# Patient Record
Sex: Female | Born: 1995 | Hispanic: Yes | Smoking: Former smoker
Health system: Southern US, Community
[De-identification: ages and names within clinical notes are randomized; demographics above are authoritative.]

## PROBLEM LIST (undated history)

## (undated) DIAGNOSIS — Z8709 Personal history of other diseases of the respiratory system: Secondary | ICD-10-CM

## (undated) DIAGNOSIS — D649 Anemia, unspecified: Secondary | ICD-10-CM

## (undated) DIAGNOSIS — J45909 Unspecified asthma, uncomplicated: Secondary | ICD-10-CM

## (undated) DIAGNOSIS — E049 Nontoxic goiter, unspecified: Secondary | ICD-10-CM

## (undated) DIAGNOSIS — K219 Gastro-esophageal reflux disease without esophagitis: Secondary | ICD-10-CM

## (undated) DIAGNOSIS — O30009 Twin pregnancy, unspecified number of placenta and unspecified number of amniotic sacs, unspecified trimester: Secondary | ICD-10-CM

## (undated) HISTORY — DX: Twin pregnancy, unspecified number of placenta and unspecified number of amniotic sacs, unspecified trimester: O30.009

## (undated) HISTORY — PX: WISDOM TOOTH EXTRACTION: SHX21

## (undated) HISTORY — PX: ECTOPIC PREGNANCY SURGERY: SHX613

## (undated) HISTORY — DX: Personal history of other diseases of the respiratory system: Z87.09

## (undated) HISTORY — PX: HIP FRACTURE SURGERY: SHX118

## (undated) HISTORY — DX: Nontoxic goiter, unspecified: E04.9

## (undated) HISTORY — PX: SALPINGECTOMY: SHX328

## (undated) HISTORY — PX: BONY PELVIS SURGERY: SHX572

## (undated) HISTORY — PX: TONSILLECTOMY: SUR1361

---
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
## 2014-11-08 DIAGNOSIS — E049 Nontoxic goiter, unspecified: Secondary | ICD-10-CM | POA: Insufficient documentation

## 2014-12-13 NOTE — Op Note (Signed)
PATIENT NAME:  Dionisio DavidRIVERA, Latunya A MR#:  [id number] DATE OF BIRTH:  01-09-1996  DATE OF PROCEDURE:  05/01/2012  PREOPERATIVE DIAGNOSIS: Suspected ectopic pregnancy.   POSTOPERATIVE DIAGNOSIS:  1. Left tubal pregnancy, unruptured.  2. 125 mL hemoperitoneum.  3. O positive blood type.   OPERATIVE PROCEDURE: Operative laparoscopy with left partial salpingectomy.   SURGEON: Prentice DockerMartin A. Ketzaly Cardella, MD   FIRST ASSISTANT: None.   ANESTHESIA: General endotracheal.   INDICATIONS: The patient is a 19 year old white Hispanic female, gravida 1, para 0, last menstrual period 03/21/2012 at approximately [redacted] weeks gestation, who presents for suspected ectopic pregnancy management. The patient has presented with abdominal and pelvic pain, bleeding, and abnormally rising hCG titers. Ultrasound revealed free peritoneal fluid and an empty uterus.   FINDINGS AT SURGERY: A 6 x 2 cm left tubal pregnancy, unruptured; there were some products of conception and clot extruding from the fimbriated end of the tube. The right tube and ovary, left ovary, and uterus were all normal. There was 125 mL hemoperitoneum identified.   DESCRIPTION OF PROCEDURE: The patient was brought to the Operating Room where she was placed in the supine position. General endotracheal anesthesia was induced without difficulty. She was placed in dorsal lithotomy position and was prepped and draped in the usual sterile fashion. The patient was placed in the low lithotomy position. A Hulka tenaculum was placed onto the cervix. The bladder was drained of 300 mL of clear urine. Laparoscopy was performed in a routine manner. A subumbilical vertical 5 mm incision was made with direct entry then being done into the abdomen with the Optiview laparoscopic trocar system. No bowel or vascular injury was encountered. Pneumoperitoneum was created. A 5 mm port was placed in the right lower quadrant along with an 11 mm port in the left lower quadrant under direct  visualization. The above-noted findings were photo documented. The hemoperitoneum was evacuated using suction. The pelvis was irrigated. The left tubal pregnancy was removed using the Ace Harmonic scalpel. The 6 x 2 cm specimen was removed without difficulty and placed in an EndoCatch which was then removed in a routine manner. Good hemostasis was noted. Copious irrigation of the pelvis was again completed. Hemostasis was confirmed. Bilateral ureteral peristalsis was noted to be normal. The upper abdomen was assessed and notable for a tense gallbladder without any evidence of adhesions or other abnormalities. The procedure was then terminated with all instrumentation being removed from the abdomen. The pneumoperitoneum was released. The incisions were closed with 4-0 Vicryl suture in a simple running manner and then covered with Dermabond. The patient was then awakened, extubated and taken to the recovery room  in satisfactory condition. Blood loss was minimal. Complications were none. All instruments, needle and sponge counts were verified as correct.  ____________________________ Prentice DockerMartin A. Aradhya Shellenbarger, MD mad:cbb D: 05/01/2012 14:13:09 ET T: 05/02/2012 09:39:14 ET JOB#: [id number]  cc: Daphine DeutscherMartin A. Shawon Denzer, MD, <Dictator> Prentice DockerMARTIN A Janiyah Beery MD ELECTRONICALLY SIGNED 05/04/2012 16:40

## 2014-12-13 NOTE — H&P (Signed)
PATIENT NAME:  Dionisio DavidRIVERA, Ira A MR#:  [id number] DATE OF BIRTH:  10-25-1995  DATE OF ADMISSION:  05/01/2012  PREOPERATIVE DIAGNOSIS: Suspected ectopic pregnancy.   HISTORY OF PRESENT ILLNESS:  Ivan AnchorsLaura Goranson is a 19 year old single Hispanic female gravida 1, para zero, last menstrual period 03/21/2012, who presents for surgical management of suspected ectopic pregnancy. The patient had a positive pregnancy test and has had several visits to the Emergency Room because of vaginal bleeding, pain, and positive hCG titers. Her hCG titers have been rising abnormally, going from 772 on 04/23/2012 to 887 on 04/28/2012 to a value of 1268 on 04/30/2012.  Ultrasound in the Emergency Room revealed an empty uterus with free fluid in the pelvis, especially located in the left adnexal region. No adnexal masses were seen. The patient's blood type is A+. In light of these findings and the patient's symptoms,  she was counseled to undergo surgical evaluation through laparoscopy.   PAST MEDICAL HISTORY/ CHRONIC ILLNESSES: Denied.   PAST SURGICAL HISTORY: None.   PAST OB HISTORY: Para zero.   FAMILY HISTORY: Negative for cancer of the colon, ovary, or breast.   SOCIAL HISTORY: The patient does not smoke, does not drink, and does not use drugs.   CURRENT MEDICATIONS: None.   DRUG ALLERGIES: None.   PHYSICAL EXAMINATION:  VITAL SIGNS: Height 65 inches, weight 168 pounds, body mass index 28, heart rate 66, blood pressure 107/67.   GENERAL: The patient is a pleasant well-appearing female in no acute distress. She is alert and oriented.   OROPHARYNX: Clear.   NECK: Supple. Thyromegaly is absent.   LUNGS: Clear.   HEART: Regular rate and rhythm without murmur.   ABDOMEN: Soft. No peritoneal signs are noted. No organomegaly.   PELVIC: External genitalia, blood tinged. BUS normal. Vagina has minimal blood in the vault. Cervix is closed to ring forceps. There was no cervical motion tenderness. Uterus is midplane,  top normal size, nontender. Adnexa nonpalpable, nontender.   RECTAL: External rectal exam is normal.   EXTREMITIES: Without clubbing, cyanosis, or edema.   SKIN: Without rash.   NEUROLOGIC: Normal.   IMPRESSION:  1. Suspected ectopic pregnancy.  2. O+ blood type.   PLAN: Laparoscopy with excision of ectopic. Date of surgery:  05/01/2012.   CONSENT NOTE: Ivan AnchorsLaura Bottcher is to undergo surgery for suspected ectopic pregnancy. She is understanding of the planned procedure and is aware of and is accepting of all surgical risks which include but are not limited to bleeding, infection, pelvic organ injury with need for repair, blood clot disorders, anesthesia risks, and death. The patient understands that we will do the minimal surgery necessary in order to optimize her fertility potential for the future. She is accepting of all risks and wishes to proceed with surgery.    ____________________________ Prentice DockerMartin A. Michal Strzelecki, MD mad:bjt D:  05/01/2012 11:47:57 ET          T: 05/01/2012 12:23:45 ET        JOB#: [id number]  Daphine DeutscherMARTIN A Michelina Mexicano MD ELECTRONICALLY SIGNED 05/04/2012 16:40

## 2015-01-03 NOTE — H&P (Signed)
L&D Evaluation:  History:  HPI 19 yo Hispanic female G2P0010 at 40.3 weeks admitted for IOL.   Patient's Medical History Mirena IUD in place   Patient's Surgical History Laparoscopic Lt Salpingectomy for ectopic   Medications Pre Natal Vitamins   Allergies NKDA   Social History none   Family History Non-Contributory   ROS:  ROS All systems were reviewed.  HEENT, CNS, GI, GU, Respiratory, CV, Renal and Musculoskeletal systems were found to be normal.   Exam:  Vital Signs stable   Urine Protein negative dipstick   General no apparent distress   Mental Status clear   Heart normal sinus rhythm   Abdomen gravid, non-tender   Estimated Fetal Weight Average for gestational age, 8#4   Back no CVAT   Edema 1+   Reflexes 2+   Pelvic no external lesions   Mebranes AROM - Clear   Ucx regular   Skin dry   Lymph no lymphadenopathy   Impression:  Impression 40.3 weeks Intrauterine pregnancy; IUD in place (unab le to remove)   Plan:  Plan Pitocin IOL   Comments O+/ATB-/NR/RI/VI/HB-/HIV-/GBS-   Electronic Signatures: Javonta Gronau, Prentice DockerMartin A (MD)  (Signed 17-Jun-14 08:33)  Authored: L&D Evaluation   Last Updated: 17-Jun-14 08:33 by Rosia Syme, Prentice DockerMartin A (MD)

## 2015-03-14 DIAGNOSIS — S32129A Unspecified Zone II fracture of sacrum, initial encounter for closed fracture: Secondary | ICD-10-CM | POA: Insufficient documentation

## 2015-05-23 DIAGNOSIS — S334XXA Traumatic rupture of symphysis pubis, initial encounter: Secondary | ICD-10-CM | POA: Insufficient documentation

## 2016-06-22 ENCOUNTER — Encounter: Payer: Self-pay | Admitting: Emergency Medicine

## 2016-06-22 DIAGNOSIS — T192XXA Foreign body in vulva and vagina, initial encounter: Secondary | ICD-10-CM | POA: Insufficient documentation

## 2016-06-22 DIAGNOSIS — Y9389 Activity, other specified: Secondary | ICD-10-CM | POA: Diagnosis not present

## 2016-06-22 DIAGNOSIS — Y999 Unspecified external cause status: Secondary | ICD-10-CM | POA: Diagnosis not present

## 2016-06-22 DIAGNOSIS — X58XXXA Exposure to other specified factors, initial encounter: Secondary | ICD-10-CM | POA: Insufficient documentation

## 2016-06-22 DIAGNOSIS — Y929 Unspecified place or not applicable: Secondary | ICD-10-CM | POA: Diagnosis not present

## 2016-06-22 DIAGNOSIS — Z5321 Procedure and treatment not carried out due to patient leaving prior to being seen by health care provider: Secondary | ICD-10-CM | POA: Insufficient documentation

## 2016-06-22 NOTE — ED Triage Notes (Signed)
Patient states that she put a tampon in about 4 hours ago. When she went to change her tampon she was unable to find the string. She states that she felt for the tampon but unable to feel it.

## 2016-06-23 ENCOUNTER — Emergency Department
Admission: EM | Admit: 2016-06-23 | Discharge: 2016-06-23 | Disposition: A | Discharge status: Home / Self Care | Payer: Medicaid Other | Attending: Emergency Medicine | Admitting: Emergency Medicine

## 2017-05-02 ENCOUNTER — Encounter: Payer: Self-pay | Admitting: Emergency Medicine

## 2017-05-02 ENCOUNTER — Emergency Department
Admission: EM | Admit: 2017-05-02 | Discharge: 2017-05-02 | Disposition: A | Discharge status: Home / Self Care | Payer: BLUE CROSS/BLUE SHIELD | Attending: Emergency Medicine | Admitting: Emergency Medicine

## 2017-05-02 DIAGNOSIS — L0231 Cutaneous abscess of buttock: Secondary | ICD-10-CM | POA: Diagnosis present

## 2017-05-02 DIAGNOSIS — L0501 Pilonidal cyst with abscess: Secondary | ICD-10-CM | POA: Diagnosis not present

## 2017-05-02 MED ORDER — HYDROCODONE-ACETAMINOPHEN 5-325 MG PO TABS: 1.0000 | ORAL_TABLET | Freq: Four times a day (QID) | ORAL | 0 refills | Status: DC | PRN

## 2017-05-02 MED ORDER — LIDOCAINE HCL (PF) 1 % IJ SOLN
5.0000 mL | Freq: Once | INTRAMUSCULAR | Status: AC
  Administered 2017-05-02: 5 mL
  Filled 2017-05-02: qty 5

## 2017-05-02 MED ORDER — SULFAMETHOXAZOLE-TRIMETHOPRIM 800-160 MG PO TABS: 1.0000 | ORAL_TABLET | Freq: Two times a day (BID) | ORAL | 0 refills | Status: DC

## 2017-05-02 NOTE — ED Triage Notes (Signed)
Pt to ED with c/o of cyst to lower sacrum. Pt stats she has had a cyst there before "many years ago" and that it returned approximately 3 weeks ago.

## 2017-05-02 NOTE — ED Provider Notes (Signed)
[facility] Emergency Department Provider Note  ____________________________________________   First MD Initiated Contact with Patient 05/02/17 1259     (approximate)  I have reviewed the triage vital signs and the nursing notes.   HISTORY  Chief Complaint Abscess   HPI Alexis Clark is a 21 y.o. female is here with complaint of cyst. Patient states that she has had this before many years ago. She states that he began returning approximately 3 weeks ago. She denies any known fever or chills. Patient states that it is more swollen today. She is not aware of any drainage. She rates her pain as an 8 out of 10.  History reviewed. No pertinent past medical history.  There are no active problems to display for this patient.   Past Surgical History:  Procedure Laterality Date  . BONY PELVIS SURGERY      Prior to Admission medications   Medication Sig Start Date End Date Taking? Authorizing Provider  HYDROcodone-acetaminophen (NORCO/VICODIN) 5-325 MG tablet Take 1 tablet by mouth every 6 (six) hours as needed for moderate pain. 05/02/17   Tommi Rumps, PA-C  sulfamethoxazole-trimethoprim (BACTRIM DS,SEPTRA DS) 800-160 MG tablet Take 1 tablet by mouth 2 (two) times daily. 05/02/17   Tommi Rumps, PA-C    Allergies Patient has no known allergies.  History reviewed. No pertinent family history.  Social History Social History  Substance Use Topics  . Smoking status: Never Smoker  . Smokeless tobacco: Never Used  . Alcohol use Yes    Review of Systems Constitutional: No fever/chills Cardiovascular: Denies chest pain. Respiratory: Denies shortness of breath. Gastrointestinal:   No nausea, no vomiting.  Skin: Positive for abscess Neurological: Negative for headaches, focal weakness or numbness.  ___________________________________________   PHYSICAL EXAM:  VITAL SIGNS:   Enc Vitals Group     BP (!) 111/55     Pulse Rate 80     Resp  18     Temp 98.1 F (36.7 C)     Temp Source Oral     SpO2 99 %     Weight 210 lb (95.3 kg)     Height  (1.651 m)     Head Circumference      Peak Flow      Pain Score 8     Pain Loc      Pain Edu?      Excl. in GC?    Constitutional: Alert and oriented. Well appearing and in no acute distress. Eyes: Conjunctivae are normal.  Head: Atraumatic. Nose: No congestion/rhinnorhea. Neck: No stridor.   Cardiovascular: Normal rate, regular rhythm. Grossly normal heart sounds.  Good peripheral circulation. Respiratory: Normal respiratory effort.  No retractions. Lungs CTAB. Gastrointestinal: Soft and nontender. No distention.  Musculoskeletal: Moves upper and lower extremities without difficulty and normal gait was noted. Neurologic:  Normal speech and language. No gross focal neurologic deficits are appreciated.  Skin:  Skin is warm, dry and intact. There is an approximate 3 cm abscess noted to the right medial buttocks that is fluctuant and moderately tender to palpation. No evidence of drainage is noted. Area has minimal erythema. Psychiatric: Mood and affect are normal. Speech and behavior are normal.  ____________________________________________   LABS (all labs ordered are listed, but only abnormal results are displayed)  Labs Reviewed - No data to display  PROCEDURES  Procedure(s) performed:  INCISION AND DRAINAGE Performed by: Tommi Rumps Consent: Verbal consent obtained. Risks and benefits: risks, benefits and  alternatives were discussed Type: abscess  Body area: Right buttocks medial aspect.  Anesthesia: local infiltration  Incision was made with a scalpel.  Local anesthetic: lidocaine 1 % without epinephrine  Anesthetic total: 2 ml  Complexity: complex Blunt dissection to break up loculations  Drainage: purulent  Drainage amount: Moderate   Packing material: 1/4 in iodoform gauze  Patient tolerance: Patient tolerated the procedure well with no  immediate complications.    Procedures  Critical Care performed: No  ____________________________________________   INITIAL IMPRESSION / ASSESSMENT AND PLAN / ED COURSE  Pertinent labs & imaging results that were available during my care of the patient were reviewed by me and considered in my medical decision making (see chart for details).  Patient is placed on Bactrim DS twice a day for 10 days and given a prescription for Norco one every 6 hours as needed for pain. She is given a note to remain out of work for the next 2 days. Patient was also given information about the surgeon on call and encouraged to call and make an appointment. Patient is also aware that she needs to return to the emergency room or go to an urgent care for removal of her packing in 2 days.   ___________________________________________   FINAL CLINICAL IMPRESSION(S) / ED DIAGNOSES  Final diagnoses:  Pilonidal abscess      NEW MEDICATIONS STARTED DURING THIS VISIT:  Discharge Medication List as of 05/02/2017  2:08 PM    START taking these medications   Details  HYDROcodone-acetaminophen (NORCO/VICODIN) 5-325 MG tablet Take 1 tablet by mouth every 6 (six) hours as needed for moderate pain., Starting Fri 05/02/2017, Print    sulfamethoxazole-trimethoprim (BACTRIM DS,SEPTRA DS) 800-160 MG tablet Take 1 tablet by mouth 2 (two) times daily., Starting Fri 05/02/2017, Print         Note:  This document was prepared using Dragon voice recognition software and may include unintentional dictation errors.    Tommi RumpsSummers, Shary Lamos L, PA-C 05/02/17 1447    Loleta RoseForbach, Cory, MD 05/02/17 [phone]

## 2017-05-02 NOTE — Discharge Instructions (Signed)
Begin taking Bactrim DS twice a day for 10 days. Norco every 6 hours as needed for pain. Do not drive or operate machinery while taking this medication. You may take ibuprofen if needed for pain in between. Return to the emergency room or any urgent care for packing removal in 2 days. The name of the surgeon along with phone number is listed on your discharge papers for a future appointment.

## 2017-05-07 ENCOUNTER — Telehealth: Payer: Self-pay | Admitting: General Practice

## 2017-05-07 NOTE — Telephone Encounter (Signed)
Left a voicemail for the patient to call the office to schedule an appointment from when she was seen in the ED, patient needs a ED Follow-up (05/02/17): Pilonidal Abscess (Requiring I&D in ED, and Place on schedule for next available surgeon. Please schedule if the patient calls.

## 2017-05-16 NOTE — Telephone Encounter (Signed)
Patient coming in on 05/22/17  With Dr. Earlene Plater

## 2017-05-22 ENCOUNTER — Ambulatory Visit: Payer: Medicaid Other | Admitting: Surgery

## 2017-05-23 ENCOUNTER — Ambulatory Visit: Payer: Self-pay | Admitting: Surgery

## 2017-06-05 ENCOUNTER — Ambulatory Visit: Payer: Self-pay | Admitting: Surgery

## 2017-06-10 ENCOUNTER — Telehealth: Payer: Self-pay | Admitting: General Practice

## 2017-06-10 NOTE — Telephone Encounter (Signed)
Patient has cancelled and no showed twice for her appointments. Unable to reschedule per nurse after three missed appointments, patient is unable to rescheduled.

## 2017-09-19 ENCOUNTER — Encounter: Payer: Self-pay | Admitting: Emergency Medicine

## 2017-09-19 ENCOUNTER — Other Ambulatory Visit: Payer: Self-pay

## 2017-09-19 ENCOUNTER — Emergency Department
Admission: EM | Admit: 2017-09-19 | Discharge: 2017-09-19 | Disposition: A | Discharge status: Home / Self Care | Payer: BLUE CROSS/BLUE SHIELD | Attending: Emergency Medicine | Admitting: Emergency Medicine

## 2017-09-19 DIAGNOSIS — L0501 Pilonidal cyst with abscess: Secondary | ICD-10-CM | POA: Diagnosis not present

## 2017-09-19 DIAGNOSIS — Z79899 Other long term (current) drug therapy: Secondary | ICD-10-CM | POA: Insufficient documentation

## 2017-09-19 DIAGNOSIS — R52 Pain, unspecified: Secondary | ICD-10-CM | POA: Diagnosis present

## 2017-09-19 MED ORDER — TRAMADOL HCL 50 MG PO TABS
50.0000 mg | ORAL_TABLET | Freq: Four times a day (QID) | ORAL | 0 refills | Status: AC | PRN
Start: 2017-09-19 — End: 2017-09-22

## 2017-09-19 MED ORDER — CLINDAMYCIN PHOSPHATE 300 MG/2ML IJ SOLN
INTRAMUSCULAR | Status: AC
  Filled 2017-09-19: qty 4

## 2017-09-19 MED ORDER — CLINDAMYCIN HCL 300 MG PO CAPS: 300.0000 mg | ORAL_CAPSULE | Freq: Three times a day (TID) | ORAL | 0 refills | Status: AC

## 2017-09-19 MED ORDER — CLINDAMYCIN PHOSPHATE 600 MG/4ML IJ SOLN
600.0000 mg | Freq: Once | INTRAMUSCULAR | Status: AC
  Administered 2017-09-19: 600 mg via INTRAMUSCULAR
  Filled 2017-09-19: qty 4

## 2017-09-19 NOTE — ED Provider Notes (Signed)
[facility] Emergency Department Provider Note  ____________________________________________  Time seen: Approximately 5:51 PM  I have reviewed the triage vital signs and the nursing notes.   HISTORY  Chief Complaint Abscess    HPI Alexis Clark is a 22 y.o. female presents to the emergency department with 8 out of 10 superior buttocks pain.  Patient has a history of pilonidal cyst.  Patient reports that she has undergone incision and drainage to the area before.  Patient reports that she made an appointment with her general surgeon and has an appointment on September 26, 2017.  Patient denies fever and chills and reports that pain is manageable but has become increasingly persistent.  Patient reports that she has had symptoms for 2 weeks.  Triage note noted.  No alleviating measures have been attempted.   History reviewed. No pertinent past medical history.  Patient Active Problem List   Diagnosis Date Noted  . 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 Zone II fracture of sacrum (HCC) 03/14/2015    Past Surgical History:  Procedure Laterality Date  . BONY PELVIS SURGERY      Prior to Admission medications   Medication Sig Start Date End Date Taking? Authorizing Provider  clindamycin (CLEOCIN) 300 MG capsule Take 1 capsule (300 mg total) by mouth 3 (three) times daily for 10 days. 09/19/17 09/29/17  Orvil Feil, PA-C  HYDROcodone-acetaminophen (NORCO/VICODIN) 5-325 MG tablet Take 1 tablet by mouth every 6 (six) hours as needed for moderate pain. 05/02/17   Tommi Rumps, PA-C  sulfamethoxazole-trimethoprim (BACTRIM DS,SEPTRA DS) 800-160 MG tablet Take 1 tablet by mouth 2 (two) times daily. 05/02/17   Tommi Rumps, PA-C  traMADol (ULTRAM) 50 MG tablet Take 1 tablet (50 mg total) by mouth every 6 (six) hours as needed for up to 3 days. 09/19/17 09/22/17  Orvil Feil, PA-C    Allergies Patient has no known allergies.  No family history on  file.  Social History Social History   Tobacco Use  . Smoking status: Never Smoker  . Smokeless tobacco: Never Used  Substance Use Topics  . Alcohol use: Yes  . Drug use: No     Review of Systems  Constitutional: No fever/chills Eyes: No visual changes. No discharge ENT: No upper respiratory complaints. Cardiovascular: no chest pain. Respiratory: no cough. No SOB. Gastrointestinal: No abdominal pain.  No nausea, no vomiting.  No diarrhea.  No constipation. Musculoskeletal: Negative for musculoskeletal pain. Skin: Patient has pilonidal cyst. Neurological: Negative for headaches, focal weakness or numbness.  ____________________________________________   PHYSICAL EXAM:  VITAL SIGNS: ED Triage Vitals  Enc Vitals Group     BP 09/19/17 1738 111/65     Pulse Rate 09/19/17 1738 73     Resp 09/19/17 1738 18     Temp 09/19/17 1738 98.4 F (36.9 C)     Temp Source 09/19/17 1738 Oral     SpO2 09/19/17 1738 99 %     Weight 09/19/17 1728 210 lb (95.3 kg)     Height 09/19/17 1728 5\' 6"  (1.676 m)     Head Circumference --      Peak Flow --      Pain Score 09/19/17 1728 6     Pain Loc --      Pain Edu? --      Excl. in GC? --      Constitutional: Alert and oriented. Well appearing and in no acute distress. Eyes: Conjunctivae are normal. PERRL.  EOMI. Cardiovascular: Normal rate, regular rhythm. Normal S1 and S2.  Good peripheral circulation. Respiratory: Normal respiratory effort without tachypnea or retractions. Lungs CTAB. Good air entry to the bases with no decreased or absent breath sounds. Musculoskeletal: Full range of motion to all extremities. No gross deformities appreciated. Neurologic:  Normal speech and language. No gross focal neurologic deficits are appreciated.  Skin: Patient has a 3 cm x 3 cm abscess along the superior aspect of the intergluteal crease.  Induration is appreciated but no fluctuance.  No surrounding cellulitis. Psychiatric: Mood and affect are  normal. Speech and behavior are normal. Patient exhibits appropriate insight and judgement.   ____________________________________________   LABS (all labs ordered are listed, but only abnormal results are displayed)  Labs Reviewed - No data to display ____________________________________________  EKG   ____________________________________________  RADIOLOGY   No results found.  ____________________________________________    PROCEDURES  Procedure(s) performed:    Procedures    Medications  clindamycin (CLEOCIN) injection 600 mg (not administered)     ____________________________________________   INITIAL IMPRESSION / ASSESSMENT AND PLAN / ED COURSE  Pertinent labs & imaging results that were available during my care of the patient were reviewed by me and considered in my medical decision making (see chart for details).  Review of the [place] CSRS was performed in accordance of the [facility] prior to dispensing any controlled drugs.     Assessment and plan Pilonidal cyst Patient presents to the emergency department with an abscess at the superior aspect of the intergluteal fold consistent with a pilonidal cyst.  I offered to conduct incision and drainage in the emergency department but advised patient that if she has an appointment with general surgery on 09/26/2017 to delay an additional incision and drainage procedure until general surgery can perform an evaluation.  Patient agreed that her pain is manageable at this point and would like to be evaluated by general surgery.  Clindamycin was given in the emergency department and patient was discharged with clindamycin as well as a short course of tramadol for pain.  Return precautions were given.  All patient questions were answered.    ____________________________________________  FINAL CLINICAL IMPRESSION(S) / ED DIAGNOSES  Final diagnoses:  Pilonidal abscess      NEW MEDICATIONS STARTED DURING THIS  VISIT:  ED Discharge Orders        Ordered    clindamycin (CLEOCIN) 300 MG capsule  3 times daily     09/19/17 1746    traMADol (ULTRAM) 50 MG tablet  Every 6 hours PRN     09/19/17 1748          This chart was dictated using voice recognition software/Dragon. Despite best efforts to proofread, errors can occur which can change the meaning. Any change was purely unintentional.    Orvil FeilWoods, Liseth Wann M, PA-C 09/19/17 1755    Myrna BlazerSchaevitz, David Matthew, MD 09/19/17 2017

## 2017-09-19 NOTE — ED Triage Notes (Signed)
asbcess to left buttock

## 2017-09-19 NOTE — ED Notes (Signed)
See triage note states she developed possible abscess area to buttocks about 1 week .

## 2017-09-26 ENCOUNTER — Ambulatory Visit (INDEPENDENT_AMBULATORY_CARE_PROVIDER_SITE_OTHER): Payer: BLUE CROSS/BLUE SHIELD | Admitting: Surgery

## 2017-09-26 ENCOUNTER — Encounter: Payer: Self-pay | Admitting: Surgery

## 2017-09-26 VITALS — BP 117/60 | HR 63 | Temp 98.0°F | Ht 65.0 in | Wt 210.0 lb

## 2017-09-26 DIAGNOSIS — L0501 Pilonidal cyst with abscess: Secondary | ICD-10-CM | POA: Diagnosis not present

## 2017-09-26 NOTE — Patient Instructions (Signed)
You have been seen today for a Pilonidal Cyst. We have scheduled for you to have this area surgical removed on 2/12 at [facility] with Dr. Aleen CampiPiscoya.  Please see your blue sheet for further instructions.  To keep this cyst as minimal as possible, you will want to shave or use Veet (Hair Remover) on this area to keep as much hair out of the tracts as you can, have a family member pull hair from the tracts if possible, keep the area clean and dry as possible. Wash with soap and water once daily and keep a guaze on this area at all times while draining.  If we excise this area (surgery) in the future, you will need to arrange to be out of work for approximately 1-2 weeks and then have a family member change the dressing 1-2 times daily until this heals from the inside out.   Please call our office with any questions or concerns.    Pilonidal Cyst A pilonidal cyst is a fluid-filled sac. It forms beneath the skin near your tailbone, at the top of the crease of your buttocks. A pilonidal cyst that is not large or infected may not cause symptoms or problems. If the cyst becomes irritated or infected, it may fill with pus. This causes pain and swelling (pilonidal abscess). An infected cyst may need to be treated with medicine, drained, or removed. CAUSES The cause of a pilonidal cyst is not known. One cause may be a hair that grows into your skin (ingrown hair). RISK FACTORS Pilonidal cysts are more common in boys and men. Risk factors include:  Having lots of hair near the crease of the buttocks.  Being overweight.  Having a pilonidal dimple.  Wearing tight clothing.  Not bathing or showering frequently.  Sitting for long periods of time. SIGNS AND SYMPTOMS Signs and symptoms of a pilonidal cyst may include:  Redness.  Pain and tenderness.  Warmth.  Swelling.  Pus.  Fever. DIAGNOSIS Your health care provider may diagnose a pilonidal cyst based on your symptoms and a physical exam. The  health care provider may do a blood test to check for infection. If your cyst is draining pus, your health care provider may take a sample of the drainage to be tested at a laboratory. TREATMENT Surgery is the usual treatment for an infected pilonidal cyst. You may also have to take medicines before surgery. The type of surgery you have depends on the size and severity of the infected cyst. The different kinds of surgery include:  Incision and drainage. This is a procedure to open and drain the cyst.  Marsupialization. In this procedure, a large cyst or abscess may be opened and kept open by stitching the edges of the skin to the cyst walls.  Cyst removal. This procedure involves opening the skin and removing all or part of the cyst. HOME CARE INSTRUCTIONS  Follow all of your surgeon's instructions carefully if you had surgery.  Take medicines only as directed by your health care provider.  If you were prescribed an antibiotic medicine, finish it all even if you start to feel better.  Keep the area around your pilonidal cyst clean and dry.  Clean the area as directed by your health care provider. Pat the area dry with a clean towel. Do not rub it as this may cause bleeding.  Remove hair from the area around the cyst as directed by your health care provider.  Do not wear tight clothing or sit in  one place for long periods of time.  There are many different ways to close and cover an incision, including stitches, skin glue, and adhesive strips. Follow your health care provider's instructions on:  Incision care.  Bandage (dressing) changes and removal.  Incision closure removal. SEEK MEDICAL CARE IF:   You have drainage, redness, swelling, or pain at the site of the cyst.  You have a fever.   This information is not intended to replace advice given to you by your health care provider. Make sure you discuss any questions you have with your health care provider.   Document  Released: 08/09/2000 Document Revised: 09/02/2014 Document Reviewed: 12/30/2013 Elsevier Interactive Patient Education [employer].

## 2017-09-26 NOTE — H&P (View-Only) (Signed)
09/26/2017  Reason for Visit:  Pilonidal cyst with abscess  History of Present Illness: Alexis Clark is a 22 y.o. female who presents with a history of pilonidal cyst with abscess.  Patient was seen in the emergency room initially in September 2018 and required incision and drainage at bedside.  She did not follow-up with us afterwards.  She did go back to the emergency room last week as she noticed that she was starting to have a flareup of her pilonidal cyst.  It did not require incision and drainage at the time and she was started on clindamycin.  However this week her cyst started spontaneously draining purulent material.  Her pain is now improved and the inflammation around the pilonidal area is improved as well.  She continues taking her antibiotics.  There is no further drainage anymore.  She presents today for further evaluation.  Denies any fevers at home or chills.  Denies any nausea or vomiting.  Past Medical History: --Pilonidal cyst  -History of asthma as a child but -History of chlamydia   Past Surgical History: Past Surgical History:  Procedure Laterality Date   Tonsillectomy    . BONY PELVIS SURGERY      Home Medications: Prior to Admission medications   Medication Sig Start Date End Date Taking? Authorizing Provider  clindamycin (CLEOCIN) 300 MG capsule Take 1 capsule (300 mg total) by mouth 3 (three) times daily for 10 days. 09/19/17 09/29/17 Yes Woods, Jaclyn M, PA-C    Allergies: No Known Allergies  Social History:  reports that  has never smoked. she has never used smokeless tobacco. She reports that she drinks alcohol. She reports that she does not use drugs.   Family History: --Diabetes type II in both mother and father's side.  Review of Systems: Review of Systems  Constitutional: Negative for chills and fever.  HENT: Negative for hearing loss.   Respiratory: Negative for shortness of breath.   Cardiovascular: Negative for chest pain.  Gastrointestinal:  Negative for abdominal pain, nausea and vomiting.  Genitourinary: Negative for dysuria.  Skin: Negative for rash.       Spontaneous drainage on 1/28 from pilonidal cyst  Neurological: Negative for dizziness.  Psychiatric/Behavioral: Negative for depression.  All other systems reviewed and are negative.   Physical Exam BP 117/60   Pulse 63   Temp 98 F (36.7 C) (Oral)   Ht 5' 5" (1.651 m)   Wt 95.3 kg (210 lb)   BMI 34.95 kg/m  CONSTITUTIONAL: No acute distress HEENT:  Normocephalic, atraumatic, extraocular motion intact. NECK: Trachea is midline, and there is no jugular venous distension.  RESPIRATORY:  Lungs are clear, and breath sounds are equal bilaterally. Normal respiratory effort without pathologic use of accessory muscles. CARDIOVASCULAR: Heart is regular without murmurs, gallops, or rubs. GI: The abdomen is soft, nondistended, nontender.  MUSCULOSKELETAL:  Normal muscle strength and tone in all four extremities.  No peripheral edema or cyanosis. SKIN: Pilonidal cyst area with no erythema or induration.  There is one small 3 mm wound consistent with spontaneous drainage from her abscess.  No further drainage at this point.  This area is nontender to palpation.  The patient does have some pits in the cleft consistent with pilonidal disease. NEUROLOGIC:  Motor and sensation is grossly normal.  Cranial nerves are grossly intact. PSYCH:  Alert and oriented to person, place and time. Affect is normal.  Laboratory Analysis: No results found for this or any previous visit (from the past 24   hour(s)).  Imaging: No results found.  Assessment and Plan: This is a 22 y.o. female who presents with pilonidal cyst with abscess.  -Discussed with the patient that currently this is her second episode of a pilonidal cyst with abscess.  She did require I&D during the first episode and this time around it has drained spontaneously.  She has had high risk of redeveloping this abscess in the  future discussed with the patient that recommendation would be to excise the cyst in the operating room.  Currently her antibiotics are working and the drainage has worked and there is no evidence of worsening infection that would require repeat I&D at this time.  After discussing the risks of the procedure including risk of bleeding, infection, and injury to surrounding structures, she is willing to proceed with excision.  We will schedule her for 10/07/17.  She is to continue her antibiotics until completed.  Understands that this is an outpatient procedure.  The patient understands this plan and all of her questions have been answered.  Face-to-face time spent with the patient and care providers was 45 minutes, with more than 50% of the time spent counseling, educating, and coordinating care of the patient.     Kathe Wirick Luis Alka Falwell, MD [facility]   

## 2017-09-26 NOTE — Progress Notes (Signed)
09/26/2017  Reason for Visit:  Pilonidal cyst with abscess  History of Present Illness: Alexis Clark is a 22 y.o. female who presents with a history of pilonidal cyst with abscess.  Patient was seen in the emergency room initially in September 2018 and required incision and drainage at bedside.  She did not follow-up with [place] afterwards.  She did go back to the emergency room last week as she noticed that she was starting to have a flareup of her pilonidal cyst.  It did not require incision and drainage at the time and she was started on clindamycin.  However this week her cyst started spontaneously draining purulent material.  Her pain is now improved and the inflammation around the pilonidal area is improved as well.  She continues taking her antibiotics.  There is no further drainage anymore.  She presents today for further evaluation.  Denies any fevers at home or chills.  Denies any nausea or vomiting.  Past Medical History: --Pilonidal cyst  -History of asthma as a child but -History of chlamydia   Past Surgical History: Past Surgical History:  Procedure Laterality Date   Tonsillectomy    . BONY PELVIS SURGERY      Home Medications: Prior to Admission medications   Medication Sig Start Date End Date Taking? Authorizing Provider  clindamycin (CLEOCIN) 300 MG capsule Take 1 capsule (300 mg total) by mouth 3 (three) times daily for 10 days. 09/19/17 09/29/17 Yes Pia Mau M, PA-C    Allergies: No Known Allergies  Social History:  reports that  has never smoked. she has never used smokeless tobacco. She reports that she drinks alcohol. She reports that she does not use drugs.   Family History: --Diabetes type II in both mother and father's side.  Review of Systems: Review of Systems  Constitutional: Negative for chills and fever.  HENT: Negative for hearing loss.   Respiratory: Negative for shortness of breath.   Cardiovascular: Negative for chest pain.  Gastrointestinal:  Negative for abdominal pain, nausea and vomiting.  Genitourinary: Negative for dysuria.  Skin: Negative for rash.       Spontaneous drainage on 1/28 from pilonidal cyst  Neurological: Negative for dizziness.  Psychiatric/Behavioral: Negative for depression.  All other systems reviewed and are negative.   Physical Exam BP 117/60   Pulse 63   Temp 98 F (36.7 C) (Oral)   Ht 5\' 5"  (1.651 m)   Wt 95.3 kg (210 lb)   BMI 34.95 kg/m  CONSTITUTIONAL: No acute distress HEENT:  Normocephalic, atraumatic, extraocular motion intact. NECK: Trachea is midline, and there is no jugular venous distension.  RESPIRATORY:  Lungs are clear, and breath sounds are equal bilaterally. Normal respiratory effort without pathologic use of accessory muscles. CARDIOVASCULAR: Heart is regular without murmurs, gallops, or rubs. GI: The abdomen is soft, nondistended, nontender.  MUSCULOSKELETAL:  Normal muscle strength and tone in all four extremities.  No peripheral edema or cyanosis. SKIN: Pilonidal cyst area with no erythema or induration.  There is one small 3 mm wound consistent with spontaneous drainage from her abscess.  No further drainage at this point.  This area is nontender to palpation.  The patient does have some pits in the cleft consistent with pilonidal disease. NEUROLOGIC:  Motor and sensation is grossly normal.  Cranial nerves are grossly intact. PSYCH:  Alert and oriented to person, place and time. Affect is normal.  Laboratory Analysis: No results found for this or any previous visit (from the past 24  hour(s)).  Imaging: No results found.  Assessment and Plan: This is a 22 y.o. female who presents with pilonidal cyst with abscess.  -Discussed with the patient that currently this is her second episode of a pilonidal cyst with abscess.  She did require I&D during the first episode and this time around it has drained spontaneously.  She has had high risk of redeveloping this abscess in the  future discussed with the patient that recommendation would be to excise the cyst in the operating room.  Currently her antibiotics are working and the drainage has worked and there is no evidence of worsening infection that would require repeat I&D at this time.  After discussing the risks of the procedure including risk of bleeding, infection, and injury to surrounding structures, she is willing to proceed with excision.  We will schedule her for 10/07/17.  She is to continue her antibiotics until completed.  Understands that this is an outpatient procedure.  The patient understands this plan and all of her questions have been answered.  Face-to-face time spent with the patient and care providers was 45 minutes, with more than 50% of the time spent counseling, educating, and coordinating care of the patient.     Howie IllJose Luis Joliene Salvador, MD [facility]

## 2017-09-29 ENCOUNTER — Telehealth: Payer: Self-pay | Admitting: Surgery

## 2017-09-29 NOTE — Telephone Encounter (Signed)
I have called patient to go over surgery information below. No answer. I have left a message on voicemail.    pre op date/time and sx date. Sx: 10/07/17 with Dr Assunta FoundPiscoya--pilonidal cyst excision-complex.  Pre op: 10/01/17 between 1-5:00pm--phone interview.   Patient made aware to call [phone], between 1-3:00pm the day before surgery, to find out what time to arrive.

## 2017-09-30 ENCOUNTER — Inpatient Hospital Stay: Admission: RE | Admit: 2017-09-30 | Payer: BLUE CROSS/BLUE SHIELD | Source: Ambulatory Visit

## 2017-10-01 ENCOUNTER — Other Ambulatory Visit: Payer: Self-pay

## 2017-10-01 ENCOUNTER — Encounter
Admission: RE | Admit: 2017-10-01 | Discharge: 2017-10-01 | Disposition: A | Discharge status: Home / Self Care | Payer: BLUE CROSS/BLUE SHIELD | Source: Ambulatory Visit | Attending: Surgery | Admitting: Surgery

## 2017-10-01 HISTORY — DX: Gastro-esophageal reflux disease without esophagitis: K21.9

## 2017-10-01 NOTE — Patient Instructions (Signed)
Your procedure is scheduled on: 10-07-17 Report to Same Day Surgery 2nd floor medical mall (Medical Mall Entrance-take elevator on left to 2nd floor.  Check in with surgery information desk.) To find out your arrival time please call [phone] between 1PM - 3PM on 10-06-17  Remember: Instructions that are not followed completely may result in serious medical risk, up to and including death, or upon the discretion of your surgeon and anesthesiologist your surgery may need to be rescheduled.    _x___ 1. Do not eat food after midnight the night before your procedure. NO GUM OR CANDY AFTER MIDNIGHT.  You may drink clear liquids up to 2 hours before you are scheduled to arrive at the hospital for your procedure.  Do not drink clear liquids within 2 hours of your scheduled arrival to the hospital.  Clear liquids include  --Water or Apple juice without pulp  --Clear carbohydrate beverage such as ClearFast or Gatorade  --Black Coffee or Clear Tea (No milk, no creamers, do not add anything to  the coffee or Tea    __x__ 2. No Alcohol for 24 hours before or after surgery.   __x__3. No Smoking or e-cigarettes for 24 prior to surgery.  Do not use any chewable tobacco products for at least 6 hour prior to surgery   ____  4. Bring all medications with you on the day of surgery if instructed.    __x__ 5. Notify your doctor if there is any change in your medical condition     (cold, fever, infections).    x___6. On the morning of surgery brush your teeth with toothpaste and water.  You may rinse your mouth with mouth wash if you wish.  Do not swallow any toothpaste or mouthwash.   Do not wear jewelry, make-up, hairpins, clips or nail polish.  Do not wear lotions, powders, or perfumes. You may wear deodorant.  Do not shave 48 hours prior to surgery. Men may shave face and neck.  Do not bring valuables to the hospital.    [facility] is not responsible for any belongings or valuables.    Contacts, dentures or bridgework may not be worn into surgery.  Leave your suitcase in the car. After surgery it may be brought to your room.  For patients admitted to the hospital, discharge time is determined by your treatment team.  _  Patients discharged the day of surgery will not be allowed to drive home.  You will need someone to drive you home and stay with you the night of your procedure.     ____ Take anti-hypertensive listed below, cardiac, seizure, asthma, anti-reflux and psychiatric medicines. These include:  1. NONE  2.  3.  4.  5.  6.  ____Fleets enema or Magnesium Citrate as directed.   ____ Use CHG Soap or sage wipes as directed on instruction sheet   ____ Use inhalers on the day of surgery and bring to hospital day of surgery  ____ Stop Metformin and Janumet 2 days prior to surgery.    ____ Take 1/2 of usual insulin dose the night before surgery and none on the morning     surgery.   ____ Follow recommendations from Cardiologist, Pulmonologist or PCP regarding stopping Aspirin, Coumadin, Plavix ,Eliquis, Effient, or Pradaxa, and Pletal.  X____Stop Anti-inflammatories such as Advil, Aleve, Ibuprofen, Motrin, Naproxen, Naprosyn, Goodies powders or aspirin products NOW-OK to take Tylenol   ____ Stop supplements until after surgery.     ____ Bring C-Pap   C-Pap to the hospital.

## 2017-10-01 NOTE — Telephone Encounter (Signed)
I have contacted patient and she has been informed of all surgery and pre op information in previous note.

## 2017-10-06 MED ORDER — CEFAZOLIN SODIUM-DEXTROSE 2-4 GM/100ML-% IV SOLN
2.0000 g | INTRAVENOUS | Status: AC
  Administered 2017-10-07: 2 g via INTRAVENOUS

## 2017-10-07 ENCOUNTER — Encounter
Admission: RE | Disposition: A | Discharge status: Home / Self Care | Payer: Self-pay | Source: Ambulatory Visit | Attending: Surgery

## 2017-10-07 ENCOUNTER — Encounter: Payer: Self-pay | Admitting: Anesthesiology

## 2017-10-07 ENCOUNTER — Ambulatory Visit
Admission: RE | Admit: 2017-10-07 | Discharge: 2017-10-07 | Disposition: A | Discharge status: Home / Self Care | Payer: BLUE CROSS/BLUE SHIELD | Source: Ambulatory Visit | Attending: Surgery | Admitting: Surgery

## 2017-10-07 ENCOUNTER — Ambulatory Visit: Payer: BLUE CROSS/BLUE SHIELD | Admitting: Certified Registered Nurse Anesthetist

## 2017-10-07 DIAGNOSIS — K219 Gastro-esophageal reflux disease without esophagitis: Secondary | ICD-10-CM | POA: Insufficient documentation

## 2017-10-07 DIAGNOSIS — L0501 Pilonidal cyst with abscess: Secondary | ICD-10-CM | POA: Insufficient documentation

## 2017-10-07 DIAGNOSIS — L0591 Pilonidal cyst without abscess: Secondary | ICD-10-CM | POA: Diagnosis not present

## 2017-10-07 HISTORY — PX: PILONIDAL CYST EXCISION: SHX744

## 2017-10-07 LAB — POCT PREGNANCY, URINE: PREG TEST UR: NEGATIVE

## 2017-10-07 SURGERY — EXCISION, PILONIDAL CYST, EXTENSIVE
Anesthesia: General | Site: Coccyx | Wound class: Clean

## 2017-10-07 MED ORDER — LACTATED RINGERS IV SOLN
INTRAVENOUS | Status: DC
  Administered 2017-10-07: 11:00:00 via INTRAVENOUS

## 2017-10-07 MED ORDER — LIDOCAINE HCL (PF) 2 % IJ SOLN
INTRAMUSCULAR | Status: AC
  Filled 2017-10-07: qty 10

## 2017-10-07 MED ORDER — LIDOCAINE HCL (CARDIAC) 20 MG/ML IV SOLN
INTRAVENOUS | Status: DC | PRN
  Administered 2017-10-07: 100 mg via INTRAVENOUS

## 2017-10-07 MED ORDER — FAMOTIDINE 20 MG PO TABS
ORAL_TABLET | ORAL | Status: AC
  Filled 2017-10-07: qty 1

## 2017-10-07 MED ORDER — MIDAZOLAM HCL 2 MG/2ML IJ SOLN
INTRAMUSCULAR | Status: DC | PRN
  Administered 2017-10-07: 2 mg via INTRAVENOUS

## 2017-10-07 MED ORDER — CHLORHEXIDINE GLUCONATE CLOTH 2 % EX PADS: 6.0000 | MEDICATED_PAD | Freq: Once | CUTANEOUS | Status: DC

## 2017-10-07 MED ORDER — ACETAMINOPHEN 500 MG PO TABS
1000.0000 mg | ORAL_TABLET | ORAL | Status: AC
  Administered 2017-10-07: 1000 mg via ORAL

## 2017-10-07 MED ORDER — ONDANSETRON HCL 4 MG/2ML IJ SOLN: 4.0000 mg | Freq: Once | INTRAMUSCULAR | Status: DC | PRN

## 2017-10-07 MED ORDER — FENTANYL CITRATE (PF) 100 MCG/2ML IJ SOLN
INTRAMUSCULAR | Status: AC
  Filled 2017-10-07: qty 2

## 2017-10-07 MED ORDER — FENTANYL CITRATE (PF) 100 MCG/2ML IJ SOLN
25.0000 ug | INTRAMUSCULAR | Status: DC | PRN
  Administered 2017-10-07 (×2): 25 ug via INTRAVENOUS

## 2017-10-07 MED ORDER — PROPOFOL 10 MG/ML IV BOLUS
INTRAVENOUS | Status: AC
  Filled 2017-10-07: qty 20

## 2017-10-07 MED ORDER — FENTANYL CITRATE (PF) 100 MCG/2ML IJ SOLN
INTRAMUSCULAR | Status: AC
  Administered 2017-10-07: 25 ug via INTRAVENOUS
  Filled 2017-10-07: qty 2

## 2017-10-07 MED ORDER — CEFAZOLIN SODIUM-DEXTROSE 2-4 GM/100ML-% IV SOLN
INTRAVENOUS | Status: AC
  Filled 2017-10-07: qty 100

## 2017-10-07 MED ORDER — PHENYLEPHRINE HCL 10 MG/ML IJ SOLN
INTRAMUSCULAR | Status: DC | PRN
  Administered 2017-10-07: 100 ug via INTRAVENOUS

## 2017-10-07 MED ORDER — FAMOTIDINE 20 MG PO TABS
20.0000 mg | ORAL_TABLET | Freq: Once | ORAL | Status: AC
  Administered 2017-10-07: 20 mg via ORAL

## 2017-10-07 MED ORDER — DEXAMETHASONE SODIUM PHOSPHATE 10 MG/ML IJ SOLN
INTRAMUSCULAR | Status: DC | PRN
  Administered 2017-10-07: 10 mg via INTRAVENOUS

## 2017-10-07 MED ORDER — MIDAZOLAM HCL 2 MG/2ML IJ SOLN
INTRAMUSCULAR | Status: AC
  Filled 2017-10-07: qty 2

## 2017-10-07 MED ORDER — BACITRACIN ZINC 500 UNIT/GM EX OINT
TOPICAL_OINTMENT | CUTANEOUS | Status: AC
  Filled 2017-10-07: qty 28.35

## 2017-10-07 MED ORDER — FENTANYL CITRATE (PF) 100 MCG/2ML IJ SOLN
INTRAMUSCULAR | Status: DC | PRN
  Administered 2017-10-07 (×2): 50 ug via INTRAVENOUS

## 2017-10-07 MED ORDER — GABAPENTIN 300 MG PO CAPS
ORAL_CAPSULE | ORAL | Status: AC
  Filled 2017-10-07: qty 1

## 2017-10-07 MED ORDER — KETOROLAC TROMETHAMINE 30 MG/ML IJ SOLN
INTRAMUSCULAR | Status: AC
  Filled 2017-10-07: qty 1

## 2017-10-07 MED ORDER — KETOROLAC TROMETHAMINE 30 MG/ML IJ SOLN
INTRAMUSCULAR | Status: DC | PRN
  Administered 2017-10-07: 30 mg via INTRAVENOUS

## 2017-10-07 MED ORDER — BUPIVACAINE-EPINEPHRINE 0.5% -1:200000 IJ SOLN
INTRAMUSCULAR | Status: DC | PRN
  Administered 2017-10-07: 30 mL

## 2017-10-07 MED ORDER — GABAPENTIN 300 MG PO CAPS
300.0000 mg | ORAL_CAPSULE | ORAL | Status: AC
  Administered 2017-10-07: 300 mg via ORAL

## 2017-10-07 MED ORDER — SUCCINYLCHOLINE CHLORIDE 20 MG/ML IJ SOLN
INTRAMUSCULAR | Status: DC | PRN
  Administered 2017-10-07: 120 mg via INTRAVENOUS

## 2017-10-07 MED ORDER — ACETAMINOPHEN 500 MG PO TABS
ORAL_TABLET | ORAL | Status: AC
  Filled 2017-10-07: qty 2

## 2017-10-07 MED ORDER — OXYCODONE HCL 5 MG PO TABS: 5.0000 mg | ORAL_TABLET | Freq: Four times a day (QID) | ORAL | 0 refills | Status: DC | PRN

## 2017-10-07 MED ORDER — PROPOFOL 10 MG/ML IV BOLUS
INTRAVENOUS | Status: DC | PRN
  Administered 2017-10-07: 150 mg via INTRAVENOUS

## 2017-10-07 MED ORDER — ONDANSETRON HCL 4 MG/2ML IJ SOLN
INTRAMUSCULAR | Status: DC | PRN
  Administered 2017-10-07: 4 mg via INTRAVENOUS

## 2017-10-07 MED ORDER — IBUPROFEN 600 MG PO TABS: 600.0000 mg | ORAL_TABLET | Freq: Three times a day (TID) | ORAL | 0 refills | Status: DC | PRN

## 2017-10-07 SURGICAL SUPPLY — 29 items
BLADE SURG 15 STRL LF DISP TIS (BLADE) ×1 IMPLANT
BLADE SURG 15 STRL SS (BLADE) ×2
BRIEF STRETCH MATERNITY 2XLG (MISCELLANEOUS) ×3 IMPLANT
CANISTER SUCT 1200ML W/VALVE (MISCELLANEOUS) ×3 IMPLANT
DRAPE LAPAROTOMY 100X77 ABD (DRAPES) ×3 IMPLANT
ELECT CAUTERY BLADE 6.4 (BLADE) ×3 IMPLANT
ELECT REM PT RETURN 9FT ADLT (ELECTROSURGICAL) ×3
ELECTRODE REM PT RTRN 9FT ADLT (ELECTROSURGICAL) ×1 IMPLANT
GAUZE FLUFF 18X24 1PLY STRL (GAUZE/BANDAGES/DRESSINGS) ×3 IMPLANT
GLOVE SURG SYN 7.0 (GLOVE) ×6 IMPLANT
GLOVE SURG SYN 7.5  E (GLOVE) ×4
GLOVE SURG SYN 7.5 E (GLOVE) ×2 IMPLANT
GOWN STRL REUS W/ TWL LRG LVL3 (GOWN DISPOSABLE) ×4 IMPLANT
GOWN STRL REUS W/TWL LRG LVL3 (GOWN DISPOSABLE) ×8
KIT TURNOVER KIT A (KITS) ×3 IMPLANT
LABEL OR SOLS (LABEL) ×3 IMPLANT
NEEDLE HYPO 22GX1.5 SAFETY (NEEDLE) ×3 IMPLANT
NS IRRIG 500ML POUR BTL (IV SOLUTION) ×3 IMPLANT
PACK BASIN MINOR ARMC (MISCELLANEOUS) ×3 IMPLANT
PAD ABD DERMACEA PRESS 5X9 (GAUZE/BANDAGES/DRESSINGS) ×3 IMPLANT
SOL PREP PVP 2OZ (MISCELLANEOUS) ×3
SOLUTION PREP PVP 2OZ (MISCELLANEOUS) ×1 IMPLANT
SUT MNCRL 4-0 (SUTURE) ×2
SUT MNCRL 4-0 27XMFL (SUTURE) ×1
SUT VIC AB 3-0 SH 27 (SUTURE) ×2
SUT VIC AB 3-0 SH 27X BRD (SUTURE) ×1 IMPLANT
SUTURE MNCRL 4-0 27XMF (SUTURE) ×1 IMPLANT
SYR 10ML LL (SYRINGE) ×3 IMPLANT
SYR BULB IRRIG 60ML STRL (SYRINGE) ×3 IMPLANT

## 2017-10-07 NOTE — Anesthesia Postprocedure Evaluation (Signed)
Anesthesia Post Note  Patient: Dionisio DavidLaura A Jolliff  Procedure(s) Performed: CYST EXCISION PILONIDAL EXTENSIVE (N/A Coccyx)  Patient location during evaluation: PACU Anesthesia Type: General Level of consciousness: awake and alert and oriented Pain management: pain level controlled Vital Signs Assessment: post-procedure vital signs reviewed and stable Respiratory status: spontaneous breathing, nonlabored ventilation and respiratory function stable Cardiovascular status: blood pressure returned to baseline and stable Postop Assessment: no signs of nausea or vomiting Anesthetic complications: no     Last Vitals:  Vitals:   10/07/17 1401 10/07/17 1407  BP: 110/61 (!) 101/58  Pulse: 67 72  Resp: 16 16  Temp: 36.5 C   SpO2: 100% 99%    Last Pain:  Vitals:   10/07/17 1349  PainSc: 2                  Kimsey Demaree

## 2017-10-07 NOTE — Anesthesia Post-op Follow-up Note (Signed)
Anesthesia QCDR form completed.        

## 2017-10-07 NOTE — Discharge Instructions (Signed)

## 2017-10-07 NOTE — Anesthesia Preprocedure Evaluation (Addendum)
Anesthesia Evaluation  Patient identified by MRN, date of birth, ID band Patient awake    Reviewed: Allergy & Precautions, NPO status , Patient's Chart, lab work & pertinent test results  Airway Mallampati: II  TM Distance: >3 FB     Dental  (+) Teeth Intact   Pulmonary  Resolving URI   Pulmonary exam normal        Cardiovascular negative cardio ROS Normal cardiovascular exam     Neuro/Psych negative neurological ROS  negative psych ROS   GI/Hepatic Neg liver ROS, GERD  Controlled,  Endo/Other  negative endocrine ROS  Renal/GU negative Renal ROS  negative genitourinary   Musculoskeletal negative musculoskeletal ROS (+)   Abdominal Normal abdominal exam  (+)   Peds negative pediatric ROS (+)  Hematology negative hematology ROS (+)   Anesthesia Other Findings Past Medical History: No date: GERD (gastroesophageal reflux disease)     Comment:  OCC-NO MEDS  Reproductive/Obstetrics                            Anesthesia Physical Anesthesia Plan  ASA: II  Anesthesia Plan: General   Post-op Pain Management:    Induction: Intravenous  PONV Risk Score and Plan:   Airway Management Planned: Oral ETT and LMA  Additional Equipment:   Intra-op Plan:   Post-operative Plan: Extubation in OR  Informed Consent: I have reviewed the patients History and Physical, chart, labs and discussed the procedure including the risks, benefits and alternatives for the proposed anesthesia with the patient or authorized representative who has indicated his/her understanding and acceptance.   Dental advisory given  Plan Discussed with: CRNA and Surgeon  Anesthesia Plan Comments:         Anesthesia Quick Evaluation

## 2017-10-07 NOTE — Interval H&P Note (Signed)
History and Physical Interval Note:  10/07/2017 10:36 AM  Alexis Clark  has presented today for surgery, with the diagnosis of pilonidal cyst with abscess  The various methods of treatment have been discussed with the patient and family. After consideration of risks, benefits and other options for treatment, the patient has consented to  Procedure(s): CYST EXCISION PILONIDAL EXTENSIVE (N/A) as a surgical intervention .  The patient's history has been reviewed, patient examined, no change in status, stable for surgery.  I have reviewed the patient's chart and labs.  Questions were answered to the patient's satisfaction.     Sharese Manrique

## 2017-10-07 NOTE — Transfer of Care (Signed)
Immediate Anesthesia Transfer of Care Note  Patient: Alexis Clark  Procedure(s) Performed: CYST EXCISION PILONIDAL EXTENSIVE (N/A Coccyx)  Patient Location: PACU  Anesthesia Type:General  Level of Consciousness: awake  Airway & Oxygen Therapy: Patient Spontanous Breathing  Post-op Assessment: Report given to RN and Post -op Vital signs reviewed and stable  Post vital signs: Reviewed and stable  Last Vitals:  Vitals:   10/07/17 1034 10/07/17 1310  BP: 115/60 116/78  Pulse: 84 96  Resp: 18 12  Temp: 37.5 C (!) 36 C  SpO2: 100% 100%    Last Pain:  Vitals:   10/07/17 1310  PainSc: 0-No pain         Complications: No apparent anesthesia complications

## 2017-10-07 NOTE — Op Note (Signed)
  Procedure Date:  10/07/2017  Pre-operative Diagnosis:   Pilonidal cyst  Post-operative Diagnosis:  Pilonidal cyst  Procedure:  Pilonidal cyst excision, closure in multiple layers  Surgeon:  Howie IllJose Luis Hokulani Rogel, MD  Anesthesia:  General endotracheal  Estimated Blood Loss:  5 ml  Specimens:  Pilonidal cyst  Complications:  None  Indications for Procedure:  This is a 22 y.o. female with diagnosis of pilonidal cyst, requiring excision.  The risks of bleeding, abscess or infection, injury to surrounding structures, and need for further procedures were all discussed with the patient and was willing to proceed.  Description of Procedure: The patient was correctly identified in the preoperative area and brought into the operating room.  The patient was placed supine with VTE prophylaxis in place.  Appropriate time-outs were performed.  Anesthesia was induced and the patient was intubated.  Appropriate antibiotics were infused.  The patient was then placed in prone jacknife position.  The patient's posterior sacral area was prepped and draped in usual sterile fashion.  An elliptical 7 cm incision was made encompassing three pilonidal pits and the cyst.  Cautery was used to dissect down the subcutaneous tissue and to dissect the cyst and pits en bloc.  Cautery was used for hemostasis as well.  The cavity was then irrigated thoroughly.  30 ml of 0.5% Bupivacaine with epi was infused throughout the cavity and dermal layers.  The wound was then closed in multiple layers using 0 Vicryl suture and 3-0 Vicryl for dermal layer.  The skin was closed using 2-0 Nylon.  The wound was cleaned and dressed with Bacitracin ointment, Telfa gauze, 4x4 gauze, and Tegaderm.   The patient was then placed in supine position, emerged from anesthesia, extubated, and brought to the recovery room for further management.  The patient tolerated the procedure well and all counts were correct at the end of the case.   Howie IllJose  Luis Adaora Mchaney, MD

## 2017-10-07 NOTE — OR Nursing (Signed)
Discharge instructions discussed with pt and sister. Both voice understanding. 

## 2017-10-07 NOTE — Anesthesia Procedure Notes (Signed)
Procedure Name: Intubation Date/Time: 10/07/2017 11:38 AM Performed by: Doreen Salvage, CRNA Pre-anesthesia Checklist: Patient identified, Patient being monitored, Timeout performed, Emergency Drugs available and Suction available Patient Re-evaluated:Patient Re-evaluated prior to induction Oxygen Delivery Method: Circle system utilized Preoxygenation: Pre-oxygenation with 100% oxygen Induction Type: IV induction Ventilation: Mask ventilation without difficulty Laryngoscope Size: Mac, Sabra Heck and 2 Grade View: Grade I Tube type: Oral Tube size: 7.0 mm Number of attempts: 1 Airway Equipment and Method: Stylet Placement Confirmation: ETT inserted through vocal cords under direct vision,  positive ETCO2 and breath sounds checked- equal and bilateral Secured at: 21 cm Tube secured with: Tape Dental Injury: Teeth and Oropharynx as per pre-operative assessment

## 2017-10-08 ENCOUNTER — Encounter: Payer: Self-pay | Admitting: Surgery

## 2017-10-08 LAB — SURGICAL PATHOLOGY

## 2017-10-15 ENCOUNTER — Encounter: Payer: Self-pay | Admitting: Surgery

## 2017-10-15 ENCOUNTER — Ambulatory Visit (INDEPENDENT_AMBULATORY_CARE_PROVIDER_SITE_OTHER): Payer: BLUE CROSS/BLUE SHIELD | Admitting: Surgery

## 2017-10-15 VITALS — BP 84/49 | HR 100 | Temp 97.8°F | Ht 65.0 in | Wt 212.0 lb

## 2017-10-15 DIAGNOSIS — Z09 Encounter for follow-up examination after completed treatment for conditions other than malignant neoplasm: Secondary | ICD-10-CM

## 2017-10-15 MED ORDER — AMOXICILLIN-POT CLAVULANATE 875-125 MG PO TABS: 1.0000 | ORAL_TABLET | Freq: Two times a day (BID) | ORAL | 0 refills | Status: AC

## 2017-10-15 NOTE — Progress Notes (Signed)
S/p pilonidal excision by Dr. Aleen CampiPiscoya Some drainage from wound No other issues  PE NAD Wound healing well, some serous drainage, very small inferior area of erythema.  A/p chronic drainage after excision of pilonidal not unexpected Short course of A/bs we will keep sutures for now rtc Dr Demetrius CharityP next week

## 2017-10-15 NOTE — Patient Instructions (Addendum)
Today we have removed your sutures and placed you on an antibiotic.  We will follow up with you as listed below:

## 2017-10-22 ENCOUNTER — Encounter: Payer: Self-pay | Admitting: Surgery

## 2017-10-23 ENCOUNTER — Encounter: Payer: Self-pay | Admitting: Surgery

## 2017-10-30 ENCOUNTER — Encounter: Payer: Self-pay | Admitting: Surgery

## 2017-10-30 ENCOUNTER — Ambulatory Visit (INDEPENDENT_AMBULATORY_CARE_PROVIDER_SITE_OTHER): Payer: BLUE CROSS/BLUE SHIELD | Admitting: Surgery

## 2017-10-30 VITALS — BP 126/76 | HR 85 | Temp 98.0°F | Ht 65.0 in | Wt 208.0 lb

## 2017-10-30 DIAGNOSIS — Z09 Encounter for follow-up examination after completed treatment for conditions other than malignant neoplasm: Secondary | ICD-10-CM

## 2017-10-30 NOTE — Progress Notes (Signed)
10/30/2017  HPI: Patient is status post pilonidal cyst excision on 2/12.  Patient presents for further follow-up.  She was seen on 2/20 by Dr. Elenor LegatoPavone during which time there was some drainage coming from the inferior portion of the wound as a precaution she was given a course of antibiotics.  Sutures were kept in place and presents now for follow-up for suture removal.  She reports that she has been doing better although there is still some soreness at the incision and some serosanguineous drainage coming from the inferior portion still.  There is no erythema or purulent fluid.  Vital signs: BP 126/76   Pulse 85   Temp 98 F (36.7 C) (Oral)   Ht 5\' 5"  (1.651 m)   Wt 94.3 kg (208 lb)   BMI 34.61 kg/m    Physical Exam: Constitutional: No acute distress Skin: Pilonidal cyst excision site wound is clean and dry with only mild serosanguineous drainage at the inferior portion of the wound.  He there was used to mildly probed the wound and it appears that is healing well otherwise.  The inferior portion of the wound and a couple other spots were treated with silver nitrate with no complications.  4 of the 8 sutures were cut and removed and dry gauze dressing with tape was applied over the wound.  Assessment/Plan: 22 year old female status post pilonidal cyst excision.  -Reassured the patient that currently things are healing well with no further evidence of any infection.  At this point silver nitrate should help with cauterized openings and helped him healing scar down.  She will come back to the office next week for removal of the remaining sutures.  She understands that if the wound does not look healthy at that time the sutures will remain in place for 1 more week.   Howie IllJose Luis Syana Degraffenreid, MD [facility]

## 2017-10-30 NOTE — Patient Instructions (Addendum)
Please see your follow up appointment listed below.  °

## 2017-11-03 DIAGNOSIS — E669 Obesity, unspecified: Secondary | ICD-10-CM | POA: Insufficient documentation

## 2017-11-06 ENCOUNTER — Ambulatory Visit (INDEPENDENT_AMBULATORY_CARE_PROVIDER_SITE_OTHER): Payer: BLUE CROSS/BLUE SHIELD | Admitting: Surgery

## 2017-11-06 ENCOUNTER — Encounter: Payer: Self-pay | Admitting: Surgery

## 2017-11-06 VITALS — BP 112/70 | HR 81 | Temp 97.9°F | Ht 65.0 in | Wt 205.6 lb

## 2017-11-06 DIAGNOSIS — Z09 Encounter for follow-up examination after completed treatment for conditions other than malignant neoplasm: Secondary | ICD-10-CM

## 2017-11-06 NOTE — Patient Instructions (Signed)
We removed the remaining sutures today. You may notice a bloody drainage. Please keep some gauze over the area.  Please continue to wash the area with soap and water daily.  Please see your follow up appointment listed below.

## 2017-11-07 ENCOUNTER — Encounter: Payer: BLUE CROSS/BLUE SHIELD | Admitting: Surgery

## 2017-11-07 NOTE — Progress Notes (Signed)
S/p pilonidal doing well Some drainage  PE NAD Wound w some mild dehiscence, rest of the stiches   A/P Doing well D/w her about wound care, this will take  Several weeks to heal No infection RTC w Dr. Aleen CampiPiscoya

## 2017-11-12 ENCOUNTER — Encounter: Payer: Self-pay | Admitting: Surgery

## 2017-11-12 ENCOUNTER — Ambulatory Visit (INDEPENDENT_AMBULATORY_CARE_PROVIDER_SITE_OTHER): Payer: BLUE CROSS/BLUE SHIELD | Admitting: Surgery

## 2017-11-12 VITALS — BP 106/68 | HR 76 | Temp 97.9°F | Wt 207.0 lb

## 2017-11-12 DIAGNOSIS — Z09 Encounter for follow-up examination after completed treatment for conditions other than malignant neoplasm: Secondary | ICD-10-CM

## 2017-11-12 NOTE — Progress Notes (Signed)
11/12/2017  HPI: Patient is s/p pilonidal cyst excision on 2/12.  The last of the sutures were removed on 3/14.  She reports doing well, with very minimal drainage from the wound.  Denies any purulent drainage, worsening pain, erythema, or induration.  Vital signs: BP 106/68   Pulse 76   Temp 97.9 F (36.6 C) (Oral)   Wt 93.9 kg (207 lb)   BMI 34.45 kg/m    Physical Exam: Constitutional: No acute distress Skin:  Pilonidal excision wound clean, without any evidence of infection.  No drainage on manual pressure.  There are multiple small openings along the incision, measuring between 2-5 mm in size.  These were treated with silver nitrate and dressed with gauze/tape.  Assessment/Plan: 22 yo female s/p pilonidal cyst excision  --silver nitrate applied to multiple small openings along the incision.  There was no evidence of infection and it is healing appropriately. --she will follow up in two weeks for another wound check.   Howie IllJose Luis Houa Nie, MD [facility]

## 2017-11-12 NOTE — Patient Instructions (Addendum)
We will see you back in 2 weeks to make sure that you are doing better. 

## 2017-11-27 ENCOUNTER — Encounter: Payer: Self-pay | Admitting: Surgery

## 2018-06-02 LAB — HM HIV SCREENING LAB: HM HIV SCREENING: NEGATIVE

## 2018-06-29 DIAGNOSIS — N926 Irregular menstruation, unspecified: Secondary | ICD-10-CM | POA: Insufficient documentation

## 2018-10-05 DIAGNOSIS — N926 Irregular menstruation, unspecified: Secondary | ICD-10-CM

## 2018-10-05 DIAGNOSIS — E049 Nontoxic goiter, unspecified: Secondary | ICD-10-CM

## 2018-10-05 DIAGNOSIS — E669 Obesity, unspecified: Secondary | ICD-10-CM

## 2018-10-05 DIAGNOSIS — Z6833 Body mass index (BMI) 33.0-33.9, adult: Secondary | ICD-10-CM

## 2019-01-15 ENCOUNTER — Telehealth: Payer: Self-pay

## 2019-01-15 NOTE — Telephone Encounter (Signed)
Coronavirus (COVID-19) Are you at risk?  Are you at risk for the Coronavirus (COVID-19)?  To be considered HIGH RISK for Coronavirus (COVID-19), you have to meet the following criteria:  . Traveled to [place], [place], [place], [place] or [place]; or in the [place] to [place], [place], [place], or [place]; and have fever, cough, and shortness of breath within the last 2 weeks of travel OR . Been in close contact with a person diagnosed with COVID-19 within the last 2 weeks and have fever, cough, and shortness of breath . IF YOU DO NOT MEET THESE CRITERIA, YOU ARE CONSIDERED LOW RISK FOR COVID-19.  What to do if you are HIGH RISK for COVID-19?  . If you are having a medical emergency, call 911. . Seek medical care right away. Before you go to a doctor's office, urgent care or emergency department, call ahead and tell them about your recent travel, contact with someone diagnosed with COVID-19, and your symptoms. You should receive instructions from your physician's office regarding next steps of care.  . When you arrive at healthcare provider, tell the healthcare staff immediately you have returned from visiting [place], [place], [place], [place] or [place]; or traveled in the [place] to [place], [place], [place], or [place]; in the last two weeks or you have been in close contact with a person diagnosed with COVID-19 in the last 2 weeks.   . Tell the health care staff about your symptoms: fever, cough and shortness of breath. . After you have been seen by a medical provider, you will be either: o Tested for (COVID-19) and discharged home on quarantine except to seek medical care if symptoms worsen, and asked to  - Stay home and avoid contact with others until you get your results (4-5 days)  - Avoid travel on public transportation if possible (such as bus, train, or airplane) or o Sent to the Emergency Department by EMS for evaluation, COVID-19 testing, and possible  admission depending on your condition and test results.  What to do if you are LOW RISK for COVID-19?  Reduce your risk of any infection by using the same precautions used for avoiding the common cold or flu:  . Wash your hands often with soap and warm water for at least 20 seconds.  If soap and water are not readily available, use an alcohol-based hand sanitizer with at least 60% alcohol.  . If coughing or sneezing, cover your mouth and nose by coughing or sneezing into the elbow areas of your shirt or coat, into a tissue or into your sleeve (not your hands). . Avoid shaking hands with others and consider head nods or verbal greetings only. . Avoid touching your eyes, nose, or mouth with unwashed hands.  . Avoid close contact with people who are [place] Dirosa. . Avoid places or events with large numbers of people in one location, like concerts or sporting events. . Carefully consider travel plans you have or are making. . If you are planning any travel outside or inside the [place], visit the CDC's Travelers' Health webpage for the latest health notices. . If you have some symptoms but not all symptoms, continue to monitor at home and seek medical attention if your symptoms worsen. . If you are having a medical emergency, call 911.  01/15/19 SCREENING NEG SLS ADDITIONAL HEALTHCARE OPTIONS FOR PATIENTS  [facility] / e-Visit: [email or web address]         [facility]: [phone]    [facility]: [phone]                   [facility]: [phone]  

## 2019-01-19 ENCOUNTER — Encounter: Payer: BLUE CROSS/BLUE SHIELD | Admitting: Certified Nurse Midwife

## 2019-01-25 ENCOUNTER — Other Ambulatory Visit: Payer: Self-pay

## 2019-01-25 ENCOUNTER — Encounter: Payer: Self-pay | Admitting: Certified Nurse Midwife

## 2019-01-25 ENCOUNTER — Other Ambulatory Visit (HOSPITAL_COMMUNITY)
Admission: RE | Admit: 2019-01-25 | Discharge: 2019-01-25 | Disposition: A | Discharge status: Home / Self Care | Payer: Medicaid Other | Source: Ambulatory Visit | Attending: Certified Nurse Midwife | Admitting: Certified Nurse Midwife

## 2019-01-25 ENCOUNTER — Ambulatory Visit: Payer: BLUE CROSS/BLUE SHIELD | Admitting: Certified Nurse Midwife

## 2019-01-25 VITALS — BP 109/76 | HR 69 | Ht 65.0 in | Wt 201.2 lb

## 2019-01-25 DIAGNOSIS — R6882 Decreased libido: Secondary | ICD-10-CM | POA: Diagnosis not present

## 2019-01-25 DIAGNOSIS — N926 Irregular menstruation, unspecified: Secondary | ICD-10-CM | POA: Insufficient documentation

## 2019-01-25 DIAGNOSIS — Z202 Contact with and (suspected) exposure to infections with a predominantly sexual mode of transmission: Secondary | ICD-10-CM

## 2019-01-25 DIAGNOSIS — Z3202 Encounter for pregnancy test, result negative: Secondary | ICD-10-CM

## 2019-01-25 LAB — POCT URINE PREGNANCY: Preg Test, Ur: NEGATIVE

## 2019-01-25 NOTE — Progress Notes (Signed)
Patient having unprotected intercourse and will like to have STD testing, took plan B 6 weeks ago and had VB 6 days after taking plan B, VB lasted 4 days, menses was due on 5/19 but she didn't start until 5/25.  Took HPT 5/19 and 5/21 both with negative result.

## 2019-01-25 NOTE — Progress Notes (Signed)
GYN ENCOUNTER NOTE  Subjective:       Alexis Clark is a 23 y.o. [id number] female is here for gynecologic evaluation of the following issues:  1. Late menses 2. Possible STI exposure  Reports late menstrual cycle after using Plan B for contraception. Desires STI testing. Questions low libido.   Denies difficulty breathing or respiratory distress, chest pain, abdominal pain, excessive vaginal bleeding, dysuria, and leg pain or swelling.    Gynecologic History  Patient's last menstrual period was 01/18/2019 (exact date). Period Cycle (Days): 28 Period Duration (Days): 5 Period Pattern: Regular Menstrual Flow: Moderate Menstrual Control: Tampon, Thin pad Dysmenorrhea: (!) Moderate Dysmenorrhea Symptoms: Cramping  Contraception: coitus interruptus  Last Pap: 07/2017. Results were: normal  Obstetric History  OB History  Gravida Para Term Preterm AB Living  2 1 1  0 1 1  SAB TAB Ectopic Multiple Live Births  0 0 1   1    # Outcome Date GA Lbr Len/2nd Weight Sex Delivery Anes PTL Lv  2 Term 02/09/13   8 lb 1 oz (3.657 kg) F Vag-Spont  N LIV  1 Ectopic 2014            Past Medical History:  Diagnosis Date  . GERD (gastroesophageal reflux disease)    OCC-NO MEDS  . History of asthma     Past Surgical History:  Procedure Laterality Date  . BONY PELVIS SURGERY     AGE 25  . ECTOPIC PREGNANCY SURGERY     REMOVED FALLOPIAN TUBE  . HIP FRACTURE SURGERY Left   . PILONIDAL CYST EXCISION N/A 10/07/2017   Procedure: CYST EXCISION PILONIDAL EXTENSIVE;  Surgeon: Henrene DodgePiscoya, Jose, MD;  Location: [facility] ORS;  Service: General;  Laterality: N/A;  . SALPINGECTOMY Left     Current Outpatient Medications on File Prior to Visit  Medication Sig Dispense Refill  . ibuprofen (ADVIL,MOTRIN) 600 MG tablet Take 1 tablet (600 mg total) by mouth every 8 (eight) hours as needed for fever, mild pain or moderate pain. 30 tablet 0   No current facility-administered medications on file prior to visit.      No Known Allergies  Social History   Socioeconomic History  . Marital status: Single    Spouse name: Not on file  . Number of children: Not on file  . Years of education: Not on file  . Highest education level: Not on file  Occupational History  . Not on file  Social Needs  . Financial resource strain: Not on file  . Food insecurity:    Worry: Not on file    Inability: Not on file  . Transportation needs:    Medical: Not on file    Non-medical: Not on file  Tobacco Use  . Smoking status: Never Smoker  . Smokeless tobacco: Never Used  Substance and Sexual Activity  . Alcohol use: Yes    Comment: occasional  . Drug use: No  . Sexual activity: Yes    Birth control/protection: None  Lifestyle  . Physical activity:    Days per week: Not on file    Minutes per session: Not on file  . Stress: Not on file  Relationships  . Social connections:    Talks on phone: Not on file    Gets together: Not on file    Attends religious service: Not on file    Active member of club or organization: Not on file    Attends meetings of clubs or organizations: Not on file  Relationship status: Not on file  . Intimate partner violence:    Fear of current or ex partner: Not on file    Emotionally abused: Not on file    Physically abused: Not on file    Forced sexual activity: Not on file  Other Topics Concern  . Not on file  Social History Narrative  . Not on file    Family History  Problem Relation Age of Onset  . Asthma Mother   . Heart disease Mother   . Diabetes Maternal Grandfather   . Diabetes Maternal Grandmother   . Breast cancer Paternal Aunt   . Ovarian cancer Neg Hx   . Colon cancer Neg Hx     The following portions of the patient's history were reviewed and updated as appropriate: allergies, current medications, past family history, past medical history, past social history, past surgical history and problem list.  Review of Systems  ROS negative except as  noted above. Information obtained from patient.   Objective:   BP 109/76   Pulse 69   Ht 5\' 5"  (1.651 m)   Wt 201 lb 3.2 oz (91.3 kg)   LMP 01/18/2019 (Exact Date)   BMI 33.48 kg/m    CONSTITUTIONAL: Well-developed, well-nourished female in no acute distress.   ABDOMEN: Soft, non distended; Non tender.  No Organomegaly.  PELVIC:  External Genitalia: Normal  Vagina: Normal, Vaginal swab collected   MUSCULOSKELETAL: Normal range of motion. No tenderness.  No cyanosis, clubbing, or edema.  Assessment:   1. Late menses - POCT urine pregnancy - Cervicovaginal ancillary only  2. Possible exposure to STD - Cervicovaginal ancillary only  3. Low libido   Plan:   Discussed side effects of Plan B usage, patient verbalized understanding.   Education regarding hormone free contraception options, see AVS.   Vaginal swab collected, see orders. Declines bloodwork for STI testing.   Reviewed red flag symptoms and when to call.   RTC as needed.    Gunnar Bulla, CNM [facility], [facility] 01/25/19 2:59 PM    A total of 20 minutes were spent face-to-face with the patient during the encounter with greater than 50% dealing with counseling and coordination of care.

## 2019-01-25 NOTE — Patient Instructions (Addendum)
Spermicide Use Spermicide is a method of preventing pregnancy (contraception). It is a barrier type of contraception. It works by blocking sperm from reaching an egg. This prevents pregnancy from taking place. Spermicides are available as:  Creams.  Foams.  Films.  Sponges.  Suppositories. Spermicides work by killing or deactivating sperm before they enter the uterus. Women release an egg (ovulate) each month during their menstrual cycle. The egg can be fertilized by a sperm following sexual intercourse. A spermicide may prevent any sperm from entering the opening to the woman's uterus (cervix) to reach an egg. You do not need to see your health care provider to get spermicide. It can be bought over the counter. Using spermicide does not affect a woman's menstrual cycle. Spermicide is much less effective than other barrier methods of preventing pregnancy. It may be more effective if it is used in combination with a condom, diaphragm, or cervical cap. Spermicide does not protect against sexually transmitted infections (STIs). What are the risks? Risks may include:  Pregnancy, especially when a spermicide is used as the only method of preventing pregnancy.  STIs.  An allergic reaction. This may cause itching or burning in the vagina.  Irritation of the vagina or penis, which may develop over time.  Infection of the vagina. This may be caused by irritation or an allergic reaction. How to use spermicide Different types of spermicides are used differently. You should follow the instructions on the spermicide package. In general: 1. Wash your hands with soap and water before each use. 2. Dry your hands with a clean towel. Make sure your fingers are dry before inserting spermicide films. 3. Insert the spermicide deep into your vagina before you have sex. ? Use the included applicator to insert spermicide foams, creams, and jellies. ? Directly insert spermicide sponges, suppositories,  and films into your vagina. 4. Wait for the amount of time shown on the package before you have sex. Do not have sex right away. Only use each application of spermicide once. Insert a new one every time you have sex. Do not douche or rinse your vagina for at least 6-8 hours after sex, or as told by your health care provider. Contact a health care provider if you:  Develop soreness, redness, or itching in or around your vagina.  Have bad-smelling discharge from your vagina.  Have pain during intercourse when using spermicide.  Have blood in your urine, or burning or pain when you urinate.  Think you may be pregnant. Summary  Spermicide is a method of preventing pregnancy (contraception). It is a barrier type of contraception.  You do not need to see your health care provider to get spermicide. It can be bought over the counter.  Spermicide is much less effective than other barrier methods of contraception. It may be more effective when used with another method of contraception, such as a condom.  Only use each application of spermicide once. This information is not intended to replace advice given to you by your health care provider. Make sure you discuss any questions you have with your health care provider. Document Released: 11/02/2002 Document Revised: 09/24/2017 Document Reviewed: 09/24/2017 Elsevier Interactive Patient Education  2019 [employer] Inc.  Natural Family Planning  Natural Family Planning (NFP) is a type of birth control (contraception) in which no form of contraceptive medicine or device is used. The NFP method relies on knowing which days of the month a woman's ovary is producing an egg (ovulation). Ovulation is the time  in the menstrual cycle when a woman is most fertile and, therefore, most likely to become pregnant. To lower the chance of pregnancy, sex is avoided during ovulation. NFP is a safe method of birth control and can prevent pregnancy if it is done  correctly. However, NFP does not provide protection from sexually transmitted diseases. NFP can also be used as a method of getting pregnant, by deciding to have sex during ovulation. How does the NFP method work? NFP works by making both sexual partners aware of how the woman's body functions during her menstrual cycle.  Usually, a woman has a menstrual period every 28-30 days. However, there can be 23-35 days between each menstrual period. This varies for each woman. A woman with a 28-day menstrual cycle has about 6 days a month when she is most likely to get pregnant.  Ovulation happens 12-14 days before the start of the next menstrual period. There are different methods that are used to determine when ovulation starts.  An egg is fertile for 24 hours after it is released from the ovary. Sperm can live for 3 days or more. What NFP methods can be used to prevent pregnancy? The basal body temperature method During ovulation, there is often a slight increase in body temperature. To use this method:  Take your temperature every morning before getting out of bed. Write the temperature on a chart.  Do not have sex from the day the menstrual periods starts until 3 days after the increase in temperature. Note that body temperature may increase as a result of various factors, including fever, restless sleep, and working schedules. The cervical mucus method Right before ovulation, mucus from the lower part of the uterus (cervix) changes from dry and sticky to wet and slippery. To use this method:  Check the mucus every day to look for these changes. Ovulation happens on the last day of wet, slippery mucus.  Do not have sex starting when you first see wet, slippery mucus and until 4 days after it stops or returns to its normal consistency.  With this method, it is safe to have sex: ? After the 4 days have passed, and until 10 days after the menstrual period starts. ? On days when mucus is  dry. Note that cervical mucus can increase or change consistency due to reasons other than ovulation, such as infection, lubricants, some medicines, and sexual arousal. Other variations of the cervical mucus method include the TwoDay method, [place] ovulation method, and the [employer]. The symptothermal method This method combines the basal body temperature and the cervical mucus methods. The calendar method This method involves tracking menstrual cycles to determine when ovulation occurs. This method is helpful when the menstrual cycle varies in length. To use this method:  For 6 months, record when menstrual periods start and end and the length of each menstrual cycle. The length of a menstrual cycle is from day 1 of the present menstrual period to day 1 of the next menstrual period.  Use this information to determine when you will likely ovulate. Avoid sex during that time. You may need help from your health care provider to determine which days you are most likely to get pregnant. ? Ovulation usually happens 12-14 days before the start of the next menstrual period. ? Light vaginal bleeding (spotting) or abdominal cramps during the middle of a menstrual cycle may be signs of ovulation. However, not all women have these symptoms. The standard days method This method is based  on studies of women's hormone levels throughout normal menstrual cycles. Based on these studies, a standard rule was developed to predict when women are most fertile during their menstrual cycle. According to the rule, if your cycle is 26-32 days long, you are most fertile between days 8 and 19. To prevent pregnancy, you should avoid having sex during this time, or use a barrier method of birth control. This method works best if your cycle is regularly between 26-32 days long. When should I not use the NFP method? You should not use NFP if:  You have very irregular menstrual periods or you sometimes skip a menstrual  period.  You have abnormal vaginal bleeding.  You recently had a baby or are breastfeeding.  You have a vaginal or cervical infection.  You take medicines that can affect vaginal mucus or body temperature. These medicines include antibiotics, thyroid medicines, and antihistamines that are found in some cold and allergy medicines.  You absolutely do not want to become pregnant at the current time. Other methods of contraception are more effective at preventing pregnancy than NFP.  You are concerned about sexually transmitted diseases. Summary  Natural Family Planning methods help women and their partners to understand how to avoid pregnancy without using medicines or other methods.  A woman learns to recognize her most fertile days. A woman with a 28 day menstrual cycle has about 6 days per month when she can get pregnant. This information is not intended to replace advice given to you by your health care provider. Make sure you discuss any questions you have with your health care provider. Document Released: 01/29/2008 Document Revised: 09/30/2016 Document Reviewed: 09/30/2016 Elsevier Interactive Patient Education  2019 Else[employer] Inc.  Diaphragm Information and Use  A diaphragm is a soft, latex or silicone dome-shaped barrier that is placed in the vagina with sperm-killing (spermicidal) jelly before sex. It covers the cervix, kills sperm, and blocks the passage of sperm into the cervix. This method does not protect against STIs (sexually transmitted infections). The diaphragm can be inserted up to 2 hours before sex. If it is inserted more than 2 hours before sex, the spermicide must be applied again. A diaphragm must be fitted by a health care provider during a pelvic exam. The health care provider will measure your vagina before prescribing a diaphragm. During the exam, you will also learn about the use and care of a diaphragm as well as possible problems. It is important to have the  diaphragm rechecked and possibly refitted if you:  Become pregnant.  Have significant weight changes (you gain or lose 20% of your body weight), such as gaining or losing 32 lb (14 kg) after weighing 160 lb (72 kg). The diaphragm should be replaced every 2 years, or sooner if damaged. What are the advantages of using a diaphragm?  You can use it while breastfeeding.  It is not felt by your sexual partner.  It does not interfere with your female hormones.  It works immediately and is not permanent.  It has few side effects or risks associated with its use.  When used properly, it is safe and effective. What are the disadvantages of using a diaphragm?  It is sometimes difficult to insert.  It may shift out of place during sex.  It requires a prescription, and you must have it fitted and refitted by your health care provider.  It may increase the risk of urinary tract infections.  There is an increased risk of toxic  shock syndrome if it is left in place for more than 24 hours.  It should not be used during your menstrual period.  It may cause you pain or discomfort during sex.  It does not protect against STIs, including HIV (human immunodeficiency virus). Using a condom is recommended to reduce these risks. How to insert a diaphragm  1. Empty your bladder by urinating before you insert the diaphragm. 2. Wash your hands with soap and water before inserting the diaphragm. If soap and water are not available, use hand sanitizer. 3. Check the diaphragm for holes by holding it up to the light, stretching the latex, or filling it with water. 4. Place the spermicide cream or jelly inside the dome and around the rim of the diaphragm. 5. Squeeze the rim of the diaphragm and insert the diaphragm into the vagina. ? The opening of the dome should face the cervix while you are inserting the diaphragm. ? The front part (or top) of the rim should be behind the pubic bone and pushed over  the top of the cervix. 6. Be sure the cervix is completely covered. Do this by reaching into your vagina and feeling the cervix behind the latex dome of the diaphragm. If you are uncomfortable, then it is not inserted properly. Try inserting it again. 7. Leave the diaphragm in for 6-8 hours after having sex. Before sex can occur again during these 6 hours, spermicide must be reapplied. 8. Do not douche with the diaphragm in place. 9. The diaphragm should not be left in place for longer than 24 hours. 10. To remove the diaphragm, insert a finger into your vagina and slip it under the rim. Then slide the diaphragm out gently. Follow these instructions at home:  Wash the diaphragm with mild soap and warm water. Rinse it thoroughly and dry it completely after every use.  Only use water-based lubricants with the diaphragm. Oil-based lubricants can damage the diaphragm. Water-based lubricants do not contain silicone, wax, or oil.  Do not use talc on the diaphragm.  Do not use the diaphragm if: ? You had a baby in the last 2 months. ? You have a vaginal infection. ? You are having a menstrual period. ? You had a recent surgery on your cervix or vagina. ? You have vaginal bleeding of unknown cause. ? Your sexual partner is allergic to latex or spermicides. Contact a health care provider if:  You have pain during sexual intercourse when using the diaphragm.  The diaphragm slips out of place during sex.  You have blood in your urine.  You have burning or pain when you urinate.  You find a hole in the diaphragm.  You develop abnormal vaginal discharge.  You have itching or irritation in your vagina.  You cannot remove the diaphragm.  You think you may be pregnant.  You need to be refitted for a diaphragm. Summary  A diaphragm is a soft, latex or silicone dome-shaped barrier that is placed in the vagina with sperm-killing (spermicidal) jelly before sex.  This method does not protect  against STIs (sexually transmitted infections).  A diaphragm requires a prescription, and you must have it fitted and refitted by your health care provider.  The diaphragm can be inserted up to 2 hours before sex.  Leave the diaphragm in for 6-8 hours after having sex. Before sex can occur again during these 6 hours, spermicide must be reapplied. This information is not intended to replace advice given to you by  your health care provider. Make sure you discuss any questions you have with your health care provider. Document Released: 11/02/2002 Document Revised: 07/01/2016 Document Reviewed: 07/01/2016 Elsevier Interactive Patient Education  2019 [employer].  Safe Sex Practicing safe sex means taking steps before and during sex to reduce your risk of:  Getting an STD (sexually transmitted disease).  Giving your partner an STD.  Unwanted pregnancy. How can I practice safe sex? To practice safe sex:  Limit your sexual partners to only one partner who is having sex with only you.  Avoid using alcohol and recreational drugs before having sex. These substances can affect your judgment.  Before having sex with a new partner: ? Talk to your partner about past partners, past STDs, and drug use. ? You and your partner should be screened for STDs and discuss the results with each other.  Check your body regularly for sores, blisters, rashes, or unusual discharge. If you notice any of these problems, visit your health care provider.  If you have symptoms of an infection or you are being treated for an STD, avoid sexual contact.  While having sex, use a condom. Make sure to: ? Use a condom every time you have vaginal, oral, or anal sex. Both females and males should wear condoms during oral sex. ? Keep condoms in place from the beginning to the end of sexual activity. ? Use a latex condom, if possible. Latex condoms offer the best protection. ? Use only water-based lubricants or oils to  lubricate a condom. Using petroleum-based lubricants or oils will weaken the condom and increase the chance that it will break.  See your health care provider for regular screenings, exams, and tests for STDs.  Talk with your health care provider about the form of birth control (contraception) that is best for you.  Get vaccinated against hepatitis B and human papillomavirus (HPV).  If you are at risk of being infected with HIV (human immunodeficiency virus), talk with your health care provider about taking a prescription medicine to prevent HIV infection. You are considered at risk for HIV if: ? You are a man who has sex with other men. ? You are a heterosexual man or woman who is sexually active with more than one partner. ? You take drugs by injection. ? You are sexually active with a partner who has HIV. This information is not intended to replace advice given to you by your health care provider. Make sure you discuss any questions you have with your health care provider. Document Released: 09/19/2004 Document Revised: 12/27/2015 Document Reviewed: 07/02/2015 Elsevier Interactive Patient Education  2019 [employer] Inc.  Preventing Pregnancy, Adult Pregnancy can occur any time you have sex. You can even become pregnant if you do not have a regular period or when you are breastfeeding. Using a form of birth control (contraception) that is best for you can help prevent pregnancy. Talk to your health care provider about the options available to you for preventing pregnancy. Work together to make a decision that is right for you based on your health, lifestyle, values, and preferences. What are options for pregnancy prevention? The only way to completely prevent pregnancy is not to have sex (practice abstinence). If you choose to be sexually active, you can use birth control every time you have sex. Birth control must be used exactly as prescribed by your health care provider, or as recommended by  instructions on the package. You may consider the following options for birth control: Reversible  prevention   Using a long-acting, reversible form of birth control, such as: ? An intrauterine device (IUD). ? An implantable or injectable hormonal birth control.  Taking birth control pills by mouth (oral pills).  Using a condom. These are most effective when used with another form of birth control, such as birth control pills or an IUD. Condoms also help protect against STIs (sexually transmitted infections).  Learning the signs of fertility and avoiding sex when you notice these signs. Signs may include: ? Increased vaginal discharge. ? Slight changes in body temperature.  Practicing natural family planning (rhythm method). This is the least effective method of preventing pregnancy. This option relies on knowing when you are most likely to release an egg (ovulate) and be most fertile. To be most effective, these methods must be used exactly as told by your health care provider. If you decide that you want to become pregnant, you can stop any of these methods at any time. Permanent prevention  A surgical procedure to prevent pregnancy permanently (sterilization). In this surgery, the fallopian tubes are either blocked or closed off. This prevents eggs from reaching the uterus. Emergency prevention  Using emergency birth control as needed. This is to be used if you have sex without using birth control and you are concerned that you might be pregnant. Emergency birth control can be purchased from a pharmacy without a prescription. It can prevent pregnancy if taken up to 72 hours after having unprotected sex. ? If you have questions about emergency birth control, ask your health care provider. ? Emergency birth control should not be used on a regular basis. Where to find support You may be able to get support for preventing pregnancy from:  Clinics and health care providers who can educate  you about birth control options. Some clinics offer services whose prices vary based on financial need (sliding scale). Most clinics take health insurance.  A clinic that offers reproductive services. You can find a clinic near you through the Department of Health and Human Services: ThisPath.fi Where can I get more information? Learn more about preventing pregnancy from:  Centers for Disease Control and Prevention: WorkplaceDirectory.at  https://miller-johnson.net/: [employer]ankCrew.uy Summary  The only completely effective way to prevent pregnancy is to avoid having sex.  Preventing pregnancy depends on finding the birth control method that works best for you. No matter which type of birth control you choose, it should be used correctly every time you have sex.  Condoms, which can be used for birth control, can also protect against STIs. This information is not intended to replace advice given to you by your health care provider. Make sure you discuss any questions you have with your health care provider. Document Released: 08/13/2016 Document Revised: 08/13/2016 Document Reviewed: 08/13/2016 Elsevier Interactive Patient Education  2019 [employer].

## 2019-01-27 ENCOUNTER — Encounter: Payer: Self-pay | Admitting: Certified Nurse Midwife

## 2019-01-27 LAB — CERVICOVAGINAL ANCILLARY ONLY
Bacterial vaginitis: POSITIVE — AB
Candida vaginitis: NEGATIVE
Chlamydia: NEGATIVE
Neisseria Gonorrhea: NEGATIVE
Trichomonas: NEGATIVE

## 2019-01-28 ENCOUNTER — Telehealth: Payer: Self-pay

## 2019-01-28 MED ORDER — METRONIDAZOLE 0.75 % VA GEL: 1.0000 | Freq: Every day | VAGINAL | 0 refills | Status: DC

## 2019-01-28 NOTE — Telephone Encounter (Signed)
Rx sent 

## 2019-07-27 ENCOUNTER — Encounter: Payer: Self-pay | Admitting: Certified Nurse Midwife

## 2019-07-27 ENCOUNTER — Other Ambulatory Visit (HOSPITAL_COMMUNITY)
Admission: RE | Admit: 2019-07-27 | Discharge: 2019-07-27 | Disposition: A | Discharge status: Home / Self Care | Payer: BLUE CROSS/BLUE SHIELD | Source: Ambulatory Visit | Attending: Certified Nurse Midwife | Admitting: Certified Nurse Midwife

## 2019-07-27 ENCOUNTER — Other Ambulatory Visit: Payer: Self-pay

## 2019-07-27 ENCOUNTER — Ambulatory Visit (INDEPENDENT_AMBULATORY_CARE_PROVIDER_SITE_OTHER): Payer: BC Managed Care – PPO | Admitting: Certified Nurse Midwife

## 2019-07-27 VITALS — BP 99/56 | HR 61 | Ht 65.0 in | Wt 191.1 lb

## 2019-07-27 DIAGNOSIS — N949 Unspecified condition associated with female genital organs and menstrual cycle: Secondary | ICD-10-CM | POA: Insufficient documentation

## 2019-07-27 NOTE — Patient Instructions (Signed)
Vaginitis Vaginitis is a condition in which the vaginal tissue swells and becomes red (inflamed). This condition is most often caused by a change in the normal balance of bacteria and yeast that live in the vagina. This change causes an overgrowth of certain bacteria or yeast, which causes the inflammation. There are different types of vaginitis, but the most common types are:  Bacterial vaginosis.  Yeast infection (candidiasis).  Trichomoniasis vaginitis. This is a sexually transmitted disease (STD).  Viral vaginitis.  Atrophic vaginitis.  Allergic vaginitis. What are the causes? The cause of this condition depends on the type of vaginitis. It can be caused by:  Bacteria (bacterial vaginosis).  Yeast, which is a fungus (yeast infection).  A parasite (trichomoniasis vaginitis).  A virus (viral vaginitis).  Low hormone levels (atrophic vaginitis). Low hormone levels can occur during pregnancy, breastfeeding, or after menopause.  Irritants, such as bubble baths, scented tampons, and feminine sprays (allergic vaginitis). Other factors can change the normal balance of the yeast and bacteria that live in the vagina. These include:  Antibiotic medicines.  Poor hygiene.  Diaphragms, vaginal sponges, spermicides, birth control pills, and intrauterine devices (IUD).  Sex.  Infection.  Uncontrolled diabetes.  A weakened defense (immune) system. What increases the risk? This condition is more likely to develop in women who:  Smoke.  Use vaginal douches, scented tampons, or scented sanitary pads.  Wear tight-fitting pants.  Wear thong underwear.  Use oral birth control pills or an IUD.  Have sex without a condom.  Have multiple sex partners.  Have an STD.  Frequently use the spermicide nonoxynol-9.  Eat lots of foods high in sugar.  Have uncontrolled diabetes.  Have low estrogen levels.  Have a weakened immune system from an immune disorder or medical  treatment.  Are pregnant or breastfeeding. What are the signs or symptoms? Symptoms vary depending on the cause of the vaginitis. Common symptoms include:  Abnormal vaginal discharge. ? The discharge is white, gray, or yellow with bacterial vaginosis. ? The discharge is thick, white, and cheesy with a yeast infection. ? The discharge is frothy and yellow or greenish with trichomoniasis.  A bad vaginal smell. The smell is fishy with bacterial vaginosis.  Vaginal itching, pain, or swelling.  Sex that is painful.  Pain or burning when urinating. Sometimes there are no symptoms. How is this diagnosed? This condition is diagnosed based on your symptoms and medical history. A physical exam, including a pelvic exam, will also be done. You may also have other tests, including:  Tests to determine the pH level (acidity or alkalinity) of your vagina.  A whiff test, to assess the odor that results when a sample of your vaginal discharge is mixed with a potassium hydroxide solution.  Tests of vaginal fluid. A sample will be examined under a microscope. How is this treated? Treatment varies depending on the type of vaginitis you have. Your treatment may include:  Antibiotic creams or pills to treat bacterial vaginosis and trichomoniasis.  Antifungal medicines, such as vaginal creams or suppositories, to treat a yeast infection.  Medicine to ease discomfort if you have viral vaginitis. Your sexual partner should also be treated.  Estrogen delivered in a cream, pill, suppository, or vaginal ring to treat atrophic vaginitis. If vaginal dryness occurs, lubricants and moisturizing creams may help. You may need to avoid scented soaps, sprays, or douches.  Stopping use of a product that is causing allergic vaginitis. Then using a vaginal cream to treat the symptoms. Follow   these instructions at home: Lifestyle  Keep your genital area clean and dry. Avoid soap, and only rinse the area with  water.  Do not douche or use tampons until your health care provider says it is okay to do so. Use sanitary pads, if needed.  Do not have sex until your health care provider approves. When you can return to sex, practice safe sex and use condoms.  Wipe from front to back. This avoids the spread of bacteria from the rectum to the vagina. General instructions  Take over-the-counter and prescription medicines only as told by your health care provider.  If you were prescribed an antibiotic medicine, take or use it as told by your health care provider. Do not stop taking or using the antibiotic even if you start to feel better.  Keep all follow-up visits as told by your health care provider. This is important. How is this prevented?  Use mild, non-scented products. Do not use things that can irritate the vagina, such as fabric softeners. Avoid the following products if they are scented: ? Feminine sprays. ? Detergents. ? Tampons. ? Feminine hygiene products. ? Soaps or bubble baths.  Let air reach your genital area. ? Wear cotton underwear to reduce moisture buildup. ? Avoid wearing underwear while you sleep. ? Avoid wearing tight pants and underwear or nylons without a cotton panel. ? Avoid wearing thong underwear.  Take off any wet clothing, such as bathing suits, as soon as possible.  Practice safe sex and use condoms. Contact a health care provider if:  You have abdominal pain.  You have a fever.  You have symptoms that last for more than 2-3 days. Get help right away if:  You have a fever and your symptoms suddenly get worse. Summary  Vaginitis is a condition in which the vaginal tissue becomes inflamed.This condition is most often caused by a change in the normal balance of bacteria and yeast that live in the vagina.  Treatment varies depending on the type of vaginitis you have.  Do not douche, use tampons , or have sex until your health care provider approves. When  you can return to sex, practice safe sex and use condoms. This information is not intended to replace advice given to you by your health care provider. Make sure you discuss any questions you have with your health care provider. Document Released: 06/09/2007 Document Revised: 07/25/2017 Document Reviewed: 09/17/2016 Elsevier Patient Education  2020 [employer]  

## 2019-07-27 NOTE — Progress Notes (Signed)
GYN ENCOUNTER NOTE  Subjective:       Alexis Clark is a 23 y.o. G14P1011 female is here for gynecologic evaluation of the following issues:  1. Pt present today for vaginal burning, mild odor,  and irritation.   Gynecologic History Patient's last menstrual period was 07/17/2019 (exact date). Contraception: none   Obstetric History OB History  Gravida Para Term Preterm AB Living  2 1 1  0 1 1  SAB TAB Ectopic Multiple Live Births  0 0 1   1    # Outcome Date GA Lbr Len/2nd Weight Sex Delivery Anes PTL Lv  2 Term 02/09/13   8 lb 1 oz (3.657 kg) F Vag-Spont  N LIV  1 Ectopic 2014            Past Medical History:  Diagnosis Date  . GERD (gastroesophageal reflux disease)    OCC-NO MEDS  . History of asthma     Past Surgical History:  Procedure Laterality Date  . BONY PELVIS SURGERY     AGE 83  . ECTOPIC PREGNANCY SURGERY     REMOVED FALLOPIAN TUBE  . HIP FRACTURE SURGERY Left   . PILONIDAL CYST EXCISION N/A 10/07/2017   Procedure: CYST EXCISION PILONIDAL EXTENSIVE;  Surgeon: 12/05/2017, MD;  Location: [facility] ORS;  Service: General;  Laterality: N/A;  . SALPINGECTOMY Left     Current Outpatient Medications on File Prior to Visit  Medication Sig Dispense Refill  . ibuprofen (ADVIL,MOTRIN) 600 MG tablet Take 1 tablet (600 mg total) by mouth every 8 (eight) hours as needed for fever, mild pain or moderate pain. 30 tablet 0   No current facility-administered medications on file prior to visit.     No Known Allergies  Social History   Socioeconomic History  . Marital status: Single    Spouse name: Not on file  . Number of children: Not on file  . Years of education: Not on file  . Highest education level: Not on file  Occupational History  . Not on file  Social Needs  . Financial resource strain: Not on file  . Food insecurity    Worry: Not on file    Inability: Not on file  . Transportation needs    Medical: Not on file    Non-medical: Not on file  Tobacco  Use  . Smoking status: Never Smoker  . Smokeless tobacco: Never Used  Substance and Sexual Activity  . Alcohol use: Yes    Comment: occasional  . Drug use: No  . Sexual activity: Not Currently    Birth control/protection: None  Lifestyle  . Physical activity    Days per week: Not on file    Minutes per session: Not on file  . Stress: Not on file  Relationships  . Social Henrene Dodge on phone: Not on file    Gets together: Not on file    Attends religious service: Not on file    Active member of club or organization: Not on file    Attends meetings of clubs or organizations: Not on file    Relationship status: Not on file  . Intimate partner violence    Fear of current or ex partner: Not on file    Emotionally abused: Not on file    Physically abused: Not on file    Forced sexual activity: Not on file  Other Topics Concern  . Not on file  Social History Narrative  . Not on file  Family History  Problem Relation Age of Onset  . Asthma Mother   . Heart disease Mother   . Diabetes Maternal Grandfather   . Diabetes Maternal Grandmother   . Breast cancer Paternal Aunt   . Ovarian cancer Neg Hx   . Colon cancer Neg Hx     The following portions of the patient's history were reviewed and updated as appropriate: allergies, current medications, past family history, past medical history, past social history, past surgical history and problem list.  Review of Systems Review of Systems - Negative except as mentioned above Review of Systems - General ROS: negative for - chills, fatigue, fever, hot flashes, malaise or night sweats Hematological and Lymphatic ROS: negative for - bleeding problems or swollen lymph nodes Gastrointestinal ROS: negative for - abdominal pain, blood in stools, change in bowel habits and nausea/vomiting Musculoskeletal ROS: negative for - joint pain, muscle pain or muscular weakness Genito-Urinary ROS: negative for - change in menstrual cycle,  dysmenorrhea, dyspareunia, dysuria, genital discharge, genital ulcers, hematuria, incontinence, irregular/heavy menses, nocturia or pelvic pain  Objective:   BP (!) 99/56   Pulse 61   Ht 5\' 5"  (1.651 m)   Wt 191 lb 2 oz (86.7 kg)   LMP 07/17/2019 (Exact Date)   BMI 31.80 kg/m  CONSTITUTIONAL: Well-developed, well-nourished female in no acute distress.  HENT:  Normocephalic, atraumatic.  NECK: Normal range of motion, supple, no masses.  Normal thyroid.  SKIN: Skin is warm and dry. No rash noted. Not diaphoretic. No erythema. No pallor. [place]: Alert and oriented to person, place, and time. PSYCHIATRIC: Normal mood and affect. Normal behavior. Normal judgment and thought content. CARDIOVASCULAR:Not Examined RESPIRATORY: Not Examined BREASTS: Not Examined ABDOMEN: Soft, non distended; Non tender.  No Organomegaly. PELVIC:  External Genitalia: Normal, mild irritation from shaving   BUS: Normal  Vagina: Normal clear-white discharge  Cervix: Normal,  Clear to white discharge, mild redness.  MUSCULOSKELETAL: Normal range of motion. No tenderness.  No cyanosis, clubbing, or edema.     Assessment:   Vaginal burning   Plan:  Swab collected. Discussed follicular irritation due to shaving. Encouraged not to shave but it she is going to to use coconut oil on skin to shave with . Will follow up with result. Discussed use of boric acid vagina suppositories. Follow up PRN.   Philip Aspen, CNM

## 2019-07-29 LAB — CERVICOVAGINAL ANCILLARY ONLY
Bacterial Vaginitis (gardnerella): POSITIVE — AB
Candida Glabrata: NEGATIVE
Candida Vaginitis: NEGATIVE
Chlamydia: NEGATIVE
Comment: NEGATIVE
Comment: NEGATIVE
Comment: NEGATIVE
Comment: NEGATIVE
Comment: NEGATIVE
Comment: NORMAL
Neisseria Gonorrhea: NEGATIVE
Trichomonas: NEGATIVE

## 2019-07-30 ENCOUNTER — Other Ambulatory Visit: Payer: Self-pay | Admitting: Certified Nurse Midwife

## 2019-07-30 MED ORDER — METRONIDAZOLE 500 MG PO TABS: 500.0000 mg | ORAL_TABLET | Freq: Two times a day (BID) | ORAL | 0 refills | Status: AC

## 2019-07-30 NOTE — Progress Notes (Signed)
Placed orders for positive BV. Pt notified vis my chart.   Philip Aspen, CNM

## 2019-10-15 ENCOUNTER — Ambulatory Visit: Payer: Self-pay

## 2019-10-26 ENCOUNTER — Ambulatory Visit (LOCAL_COMMUNITY_HEALTH_CENTER): Payer: Medicaid Other | Admitting: Family Medicine

## 2019-10-26 ENCOUNTER — Encounter: Payer: Self-pay | Admitting: Family Medicine

## 2019-10-26 ENCOUNTER — Other Ambulatory Visit: Payer: Self-pay

## 2019-10-26 VITALS — BP 115/67 | Ht 65.0 in | Wt 200.4 lb

## 2019-10-26 DIAGNOSIS — Z3009 Encounter for other general counseling and advice on contraception: Secondary | ICD-10-CM | POA: Diagnosis not present

## 2019-10-26 LAB — PREGNANCY, URINE: Preg Test, Ur: NEGATIVE

## 2019-10-26 MED ORDER — ELLA 30 MG PO TABS: 1.0000 | ORAL_TABLET | Freq: Once | ORAL | 0 refills | Status: AC

## 2019-10-26 MED ORDER — NORGESTIM-ETH ESTRAD TRIPHASIC 0.18/0.215/0.25 MG-25 MCG PO TABS: 1.0000 | ORAL_TABLET | Freq: Every day | ORAL | 3 refills | Status: DC

## 2019-10-26 MED ORDER — LEVONORGESTREL 1.5 MG PO TABS: 1.5000 mg | ORAL_TABLET | Freq: Once | ORAL | 0 refills | Status: DC

## 2019-10-26 MED ORDER — ULIPRISTAL ACETATE 30 MG PO TABS: 30.0000 mg | ORAL_TABLET | Freq: Once | ORAL | Status: DC

## 2019-10-26 NOTE — Progress Notes (Signed)
Patient here to start Depo. Urine pregnancy test ordered per provider.Burt Knack, RN

## 2019-10-26 NOTE — Progress Notes (Signed)
Family Planning Visit  Subjective:  Alexis Clark is a 24 y.o. being seen today for  Chief Complaint  Patient presents with  . Contraception    wants to start Depo    Pt has Traumatic rupture of symphysis pubis; Zone II fracture of sacrum (HCC); Pilonidal cyst with abscess; Pilonidal cyst without abscess; Irregular menstrual cycle; Obesity, unspecified; and Enlargement of thyroid on their problem list.  HPI  Patient reports she would like to start [facility] Interested in OCPs.   Pt does not meet any of the following contraindications to estrogen use: -Age ?35 years and smoking ?15 cigarettes per day -Migraine with aura -Two or more RF for arterial CVD (such as older age, smoking, diabetes, and hypertension) -HTN -Breast cancer -VTE hx or acute event -Known thrombogenic mutations -Known ischemic heart disease -History of stroke -Complicated valvular heart disease (pulmonary HTN, risk for afib, hx subacute bacterial endocarditis) -Cirrhosis, Hepatocellular adenoma or malignant hepatoma    Patient's last menstrual period was 10/16/2019 (exact date). Last sex: 3 days ago BCM: none Pt desires EC? yes  Last pap: 06/02/2018: negative  Patient reports 2 partner(s) in last year. Do they desire STI screening (if no, why not)? Yes, only GC/chlamydia   Does the patient desire a pregnancy in the next year? no   24 y.o., Body mass index is 33.35 kg/m. - Is patient eligible for HA1C diabetes screening based on BMI and age >48?  no  Does the patient have a current or past history of drug use? no No components found for: HCV  See flowsheet for other program required questions.   Health Maintenance Due  Topic Date Due  . TETANUS/TDAP  08/18/2014  . PAP-Cervical Cytology Screening  08/18/2016  . PAP SMEAR-Modifier  08/18/2016  . INFLUENZA VACCINE  03/27/2019    ROS  The following portions of the patient's history were reviewed and updated as appropriate: allergies, current  medications, past family history, past medical history, past social history, past surgical history and problem list. Problem list updated.  Objective:  BP 115/67   Ht 5\' 5"  (1.651 m)   Wt 200 lb 6.4 oz (90.9 kg)   LMP 10/16/2019 (Exact Date)   BMI 33.35 kg/m    Physical Exam  Gen: well appearing, NAD HEENT: no scleral icterus Lung: Normal WOB Ext: well perfused, no edema  *Pt declines further physical exam. Elects to self collect GC/chlamydia swab.  Assessment and Plan:  Alexis Clark is a 24 y.o. female presenting to the [facility] Department for a well woman exam/family planning visit  Contraception counseling: Reviewed all forms of birth control options in the tiered based approach including abstinence; over the counter/barrier methods; hormonal contraceptive medication including pill, patch, ring, injection, contraceptive implant; hormonal and nonhormonal IUDs; permanent sterilization options including vasectomy and the various tubal sterilization modalities. Risks, benefits, how to discontinue and typical effectiveness rates were reviewed.  Questions were answered.  Written information was also given to the patient to review.  Patient desires OCP, this was prescribed for patient. She will follow up in  1 year for surveillance.  She was told to call with any further questions, or with any concerns about this method of contraception.  Emphasized use of condoms 100% of the time for STI prevention.  Emergency Contraception: Pt was offered ECP. ECP was accepted by pt. ECP counseling was given - see RN documentation.   1. Family planning -Rx OCP x1 yr, pt to start pills 5 days after  taking EC. Add'l BC counseling as above.  -Rx ECP, advised condoms x2 wks, home pregnancy test in 11 days.  -Pt accepts GC/chlamydia screen, declines other STI screenings including physical exam.  -Next pap due 05/2021 - Pregnancy, urine - Norgestimate-Ethinyl Estradiol Triphasic (ORTHO  TRI-CYCLEN LO) 0.18/0.215/0.25 MG-25 MCG tab; Take 1 tablet by mouth daily.  Dispense: 3 Package; Refill: 3 - Chlamydia/Gonorrhea [facility] - ulipristal acetate (ELLA) tablet 30 mg     Return in about 1 year (around 10/25/2020) for yearly wellness exam.  No future appointments.  Kandee Keen, PA-C

## 2019-10-26 NOTE — Progress Notes (Addendum)
Last PE 11/03/2017, no Pap results found. PT negative.Burt Knack, RN

## 2019-10-26 NOTE — Progress Notes (Signed)
Patient signed consents for OC and ECP. Counseled to take home PT in 11 days per providers orders.Burt Knack, RN

## 2019-10-27 ENCOUNTER — Other Ambulatory Visit: Payer: Self-pay | Admitting: Family Medicine

## 2019-10-27 DIAGNOSIS — Z3009 Encounter for other general counseling and advice on contraception: Secondary | ICD-10-CM

## 2019-10-27 DIAGNOSIS — Z7251 High risk heterosexual behavior: Secondary | ICD-10-CM

## 2019-10-27 MED ORDER — ULIPRISTAL ACETATE 30 MG PO TABS: 1.0000 | ORAL_TABLET | Freq: Once | ORAL | 0 refills | Status: AC

## 2019-10-27 MED ORDER — NORGESTIM-ETH ESTRAD TRIPHASIC 0.18/0.215/0.25 MG-25 MCG PO TABS: 1.0000 | ORAL_TABLET | Freq: Every day | ORAL | 3 refills | Status: DC

## 2020-01-18 ENCOUNTER — Encounter: Payer: Self-pay | Admitting: Certified Nurse Midwife

## 2020-01-18 ENCOUNTER — Other Ambulatory Visit: Payer: Self-pay

## 2020-01-18 ENCOUNTER — Ambulatory Visit (INDEPENDENT_AMBULATORY_CARE_PROVIDER_SITE_OTHER): Payer: Medicaid Other | Admitting: Certified Nurse Midwife

## 2020-01-18 VITALS — BP 107/59 | HR 68 | Ht 65.0 in | Wt 205.0 lb

## 2020-01-18 DIAGNOSIS — Z3201 Encounter for pregnancy test, result positive: Secondary | ICD-10-CM

## 2020-01-18 DIAGNOSIS — N926 Irregular menstruation, unspecified: Secondary | ICD-10-CM | POA: Diagnosis not present

## 2020-01-18 LAB — POCT URINE PREGNANCY: Preg Test, Ur: POSITIVE — AB

## 2020-01-18 NOTE — Progress Notes (Signed)
Subjective:    Alexis Clark is a 24 y.o. female who presents for evaluation of amenorrhea. She believes she could be pregnant. Pregnancy is desired. Sexual Activity: single partner, contraception: none. Current symptoms also include: breast tenderness and fatigue. Last period was normal.   Patient's last menstrual period was 12/18/2019 (approximate). The following portions of the patient's history were reviewed and updated as appropriate: allergies, current medications, past family history, past medical history, past social history, past surgical history and problem list.  Review of Systems Pertinent items are noted in HPI.     Objective:    BP (!) 107/59   Pulse 68   Ht 5\' 5"  (1.651 m)   Wt 205 lb (93 kg)   LMP 12/18/2019 (Approximate)   BMI 34.11 kg/m  General: alert, cooperative, appears stated age, no distress and no acute distress    Lab Review Urine HCG: positive    Assessment:    Absence of menstruation.     Plan:  Positive: EDC: 09/23/20 Briefly discussed pre-natal care options.MD pr midwifery care discussed. Encouraged well-balanced diet, plenty of rest when needed, pre-natal vitamins daily and walking for exercise. Discussed self-help for nausea, avoiding OTC medications until consulting provider or pharmacist, other than Tylenol as needed, minimal caffeine (1-2 cups daily) and avoiding alcohol. She will schedule u/s for viability/dating, Nurse visit @ 10 wks and her initial OB visit at 12-[redacted] wks pregnant. Feel  free to call with any questions.  09/25/20, CNM

## 2020-01-18 NOTE — Patient Instructions (Signed)

## 2020-02-02 ENCOUNTER — Ambulatory Visit (INDEPENDENT_AMBULATORY_CARE_PROVIDER_SITE_OTHER): Payer: Self-pay

## 2020-02-02 ENCOUNTER — Other Ambulatory Visit: Payer: Self-pay

## 2020-02-02 DIAGNOSIS — O418X10 Other specified disorders of amniotic fluid and membranes, first trimester, not applicable or unspecified: Secondary | ICD-10-CM

## 2020-02-02 DIAGNOSIS — O209 Hemorrhage in early pregnancy, unspecified: Secondary | ICD-10-CM

## 2020-02-02 DIAGNOSIS — Z3A01 Less than 8 weeks gestation of pregnancy: Secondary | ICD-10-CM

## 2020-02-02 DIAGNOSIS — O468X1 Other antepartum hemorrhage, first trimester: Secondary | ICD-10-CM

## 2020-02-02 DIAGNOSIS — N926 Irregular menstruation, unspecified: Secondary | ICD-10-CM

## 2020-02-07 ENCOUNTER — Telehealth: Payer: Self-pay | Admitting: Obstetrics and Gynecology

## 2020-02-07 NOTE — Telephone Encounter (Signed)
Pt called and stated that she had some cramping over the weekend and that it was stopped. The pt stated some spotting, and some nauseas but not vomiting. Called back to CM due to JW out of the office. CM stated to sent you the message.  Please advise

## 2020-02-07 NOTE — Telephone Encounter (Signed)
Spoke to pt concerning her call to the office. Pt stated that she noticed the cramping and spotting over the weekend. Pt stated not having sex intercourse before the spotting started. Pt stated that she currently bleeding now and she spotting regularly and cramping.

## 2020-02-08 ENCOUNTER — Ambulatory Visit (INDEPENDENT_AMBULATORY_CARE_PROVIDER_SITE_OTHER): Payer: Self-pay | Admitting: Obstetrics and Gynecology

## 2020-02-08 ENCOUNTER — Encounter: Payer: Self-pay | Admitting: Obstetrics and Gynecology

## 2020-02-08 ENCOUNTER — Other Ambulatory Visit: Payer: Self-pay

## 2020-02-08 VITALS — BP 112/66 | HR 64 | Ht 65.0 in | Wt 206.7 lb

## 2020-02-08 DIAGNOSIS — R102 Pelvic and perineal pain: Secondary | ICD-10-CM

## 2020-02-08 DIAGNOSIS — O26859 Spotting complicating pregnancy, unspecified trimester: Secondary | ICD-10-CM

## 2020-02-08 DIAGNOSIS — Z3A01 Less than 8 weeks gestation of pregnancy: Secondary | ICD-10-CM
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
## 2020-02-12 ENCOUNTER — Emergency Department
Admission: EM | Admit: 2020-02-12 | Discharge: 2020-02-12 | Disposition: A | Discharge status: Home / Self Care | Payer: No Typology Code available for payment source | Attending: Emergency Medicine | Admitting: Emergency Medicine

## 2020-02-12 ENCOUNTER — Emergency Department: Payer: No Typology Code available for payment source

## 2020-02-12 ENCOUNTER — Other Ambulatory Visit: Payer: Self-pay

## 2020-02-12 DIAGNOSIS — Z79899 Other long term (current) drug therapy: Secondary | ICD-10-CM | POA: Diagnosis not present

## 2020-02-12 DIAGNOSIS — N939 Abnormal uterine and vaginal bleeding, unspecified: Secondary | ICD-10-CM | POA: Insufficient documentation

## 2020-02-12 DIAGNOSIS — O209 Hemorrhage in early pregnancy, unspecified: Secondary | ICD-10-CM

## 2020-02-12 LAB — HCG, QUANTITATIVE, PREGNANCY: hCG, Beta Chain, Quant, S: 269954 m[IU]/mL — ABNORMAL HIGH (ref <5)

## 2020-02-12 LAB — URINALYSIS, COMPLETE (UACMP) WITH MICROSCOPIC
Bilirubin Urine: NEGATIVE
Glucose, UA: NEGATIVE mg/dL
Ketones, ur: NEGATIVE mg/dL
Leukocytes,Ua: NEGATIVE
Nitrite: NEGATIVE
Protein, ur: NEGATIVE mg/dL
Specific Gravity, Urine: 1.009 (ref 1.005–1.030)
pH: 5 (ref 5.0–8.0)

## 2020-02-12 LAB — COMPREHENSIVE METABOLIC PANEL
ALT: 17 U/L (ref 0–44)
AST: 18 U/L (ref 15–41)
Albumin: 3.7 g/dL (ref 3.5–5.0)
Alkaline Phosphatase: 48 U/L (ref 38–126)
Anion gap: 8 (ref 5–15)
BUN: 7 mg/dL (ref 6–20)
CO2: 22 mmol/L (ref 22–32)
Calcium: 9.2 mg/dL (ref 8.9–10.3)
Chloride: 105 mmol/L (ref 98–111)
Creatinine, Ser: 0.52 mg/dL (ref 0.44–1.00)
GFR calc Af Amer: >60 mL/min (ref >60)
GFR calc non Af Amer: >60 mL/min (ref >60)
Glucose, Bld: 108 mg/dL — ABNORMAL HIGH (ref 70–99)
Potassium: 3.3 mmol/L — ABNORMAL LOW (ref 3.5–5.1)
Sodium: 135 mmol/L (ref 135–145)
Total Bilirubin: 0.5 mg/dL (ref 0.3–1.2)
Total Protein: 7.4 g/dL (ref 6.5–8.1)

## 2020-02-12 LAB — CBC
HCT: 35.2 % — ABNORMAL LOW (ref 36.0–46.0)
Hemoglobin: 12.6 g/dL (ref 12.0–15.0)
MCH: 29.2 pg (ref 26.0–34.0)
MCHC: 35.8 g/dL (ref 30.0–36.0)
MCV: 81.7 fL (ref 80.0–100.0)
Platelets: 228 10*3/uL (ref 150–400)
RBC: 4.31 MIL/uL (ref 3.87–5.11)
RDW: 12.8 % (ref 11.5–15.5)
WBC: 8.9 10*3/uL (ref 4.0–10.5)
nRBC: 0 % (ref 0.0–0.2)

## 2020-02-12 LAB — ABO/RH: ABO/RH(D): O POS

## 2020-02-12 MED ORDER — CEPHALEXIN 500 MG PO CAPS: 500.0000 mg | ORAL_CAPSULE | Freq: Three times a day (TID) | ORAL | 0 refills | Status: AC

## 2020-02-12 NOTE — Discharge Instructions (Addendum)
Continue taking prenatal vitamins. Please take it easy over the next 1 to 2 days. Please keep follow-up appointment with OB/GYN. Please return to the emergency department if vaginal bleeding or pelvic discomfort worsens. Take Keflex three times daily for the next week.

## 2020-02-12 NOTE — ED Provider Notes (Signed)
Emergency Department Provider Note  ____________________________________________  Time seen: Approximately 9:14 PM  I have reviewed the triage vital signs and the nursing notes.   HISTORY  Chief Complaint Vaginal Bleeding   Historian Patient     HPI Alexis Clark is a 24 y.o. female G3, P1, presents to the emergency department with vaginal bleeding.  Patient states that she felt a gush of vaginal bleeding earlier in the day.  She states that vaginal bleeding has improved.  She has had some pelvic cramping and some low back pain.  She denies current abdominal pain.  No gush of clear vaginal fluid.  She denies fever or chills at home.  Patient is currently under the care of [facility].   Past Medical History:  Diagnosis Date  . GERD (gastroesophageal reflux disease)    OCC-NO MEDS  . History of asthma      Immunizations up to date:  Yes.     Past Medical History:  Diagnosis Date  . GERD (gastroesophageal reflux disease)    OCC-NO MEDS  . History of asthma     Patient Active Problem List   Diagnosis Date Noted  . Irregular menstrual cycle 06/29/2018  . Obesity, unspecified 11/03/2017  . Pilonidal cyst without abscess   . Pilonidal cyst with abscess 09/26/2017  . 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 Zone II fracture of sacrum (HCC) 03/14/2015  . 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 BONY PELVIS SURGERY     AGE 24  . ECTOPIC PREGNANCY SURGERY     REMOVED FALLOPIAN TUBE  . HIP FRACTURE SURGERY Left   . PILONIDAL CYST EXCISION N/A 10/07/2017   Procedure: CYST EXCISION PILONIDAL EXTENSIVE;  Surgeon: Henrene Dodge, MD;  Location: [facility] ORS;  Service: General;  Laterality: N/A;  . SALPINGECTOMY Left     Prior to Admission medications   Medication Sig Start Date End Date Taking? Authorizing Provider  Prenatal Vit-Fe Fumarate-FA (PRENATAL MULTIVITAMIN) TABS tablet Take 1 tablet by  mouth daily at 12 noon.    [provider]    Allergies Patient has no known allergies.  Family History  Problem Relation Age of Onset  . Asthma Mother   . Heart disease Mother   . Diabetes Maternal Grandfather   . Diabetes Maternal Grandmother   . Breast cancer Paternal Aunt   . Ovarian cancer Neg Hx   . Colon cancer Neg Hx     Social History Social History   Tobacco Use  . Smoking status: Never Smoker  . Smokeless tobacco: Never Used  Vaping Use  . Vaping Use: Never used  Substance Use Topics  . Alcohol use: Not Currently    Alcohol/week: 2.0 standard drinks    Types: 2 Cans of beer per week  . Drug use: No     Review of Systems  Constitutional: No fever/chills Eyes:  No discharge ENT: No upper respiratory complaints. Respiratory: no cough. No SOB/ use of accessory muscles to breath Gastrointestinal:   No nausea, no vomiting.  No diarrhea.  No constipation. Genitourinary: Patient has vaginal bleeding.  Musculoskeletal: Negative for musculoskeletal pain. Skin: Negative for rash, abrasions, lacerations, ecchymosis.    ____________________________________________   PHYSICAL EXAM:  VITAL SIGNS: ED Triage Vitals  Enc Vitals Group     BP 02/12/20 1624 (!) 118/56     Pulse Rate 02/12/20 1624 74     Resp 02/12/20 1624 18  Temp 02/12/20 1624 98.8 F (37.1 C)     Temp Source 02/12/20 1624 Oral     SpO2 02/12/20 1624 97 %     Weight 02/12/20 1625 205 lb (93 kg)     Height 02/12/20 1625 5\' 5"  (1.651 m)     Head Circumference --      Peak Flow --      Pain Score 02/12/20 1624 5     Pain Loc --      Pain Edu? --      Excl. in GC? --      Constitutional: Alert and oriented. Well appearing and in no acute distress. Eyes: Conjunctivae are normal. PERRL. EOMI. Head: Atraumatic. Cardiovascular: Normal rate, regular rhythm. Normal S1 and S2.  Good peripheral circulation. Respiratory: Normal respiratory effort without tachypnea or retractions. Lungs  CTAB. Good air entry to the bases with no decreased or absent breath sounds Gastrointestinal: Bowel sounds x 4 quadrants. Soft and nontender to palpation. No guarding or rigidity. No distention. Musculoskeletal: Full range of motion to all extremities. No obvious deformities noted Neurologic:  Normal for age. No gross focal neurologic deficits are appreciated.  Skin:  Skin is warm, dry and intact. No rash noted. Psychiatric: Mood and affect are normal for age. Speech and behavior are normal.   ____________________________________________   LABS (all labs ordered are listed, but only abnormal results are displayed)  Labs Reviewed  CBC - Abnormal; Notable for the following components:      Result Value   HCT 35.2 (*)    All other components within normal limits  COMPREHENSIVE METABOLIC PANEL - Abnormal; Notable for the following components:   Potassium 3.3 (*)    Glucose, Bld 108 (*)    All other components within normal limits  HCG, QUANTITATIVE, PREGNANCY - Abnormal; Notable for the following components:   hCG, Beta 02/14/20 Francene Finders (*)    All other components within normal limits  ABO/RH   ____________________________________________  EKG   ____________________________________________  RADIOLOGY [id number], personally viewed and evaluated these images (plain radiographs) as part of my medical decision making, as well as reviewing the written report by the radiologist.  Geraldo Pitter OB Comp AddL Gest Less 14 Wks  Result Date: 02/12/2020 CLINICAL DATA:  24 year old pregnant female with vaginal bleeding. LMP: 12/18/2019 corresponding to an estimated gestational age of [redacted] weeks, 0 days. EXAM: TWIN OBSTETRIC <14WK 12/20/2019 AND TRANSVAGINAL OB US COMPARISON:  Ultrasound dated 02/02/2020. FINDINGS: Number of IUPs:  2 Chorionicity/Amnionicity:  Monochorionic-diamniotic (thin membrane) TWIN 1 Yolk sac:  Seen Embryo:  Present Cardiac Activity: Detected Heart Rate: 158 bpm CRL: 19 mm   8 w 3  d                  04/03/2020 EDC: 09/20/2020 TWIN 2 Yolk sac:  Seen Embryo:  Detected Cardiac Activity: Present Heart Rate: 147 bpm CRL: 17 mm   8 w 1 d                  09/22/2020 EDC: 09/22/2020 Subchorionic hemorrhage: Small subchorionic hemorrhage measuring up to 15 mm. Maternal uterus/adnexae: The maternal ovaries are unremarkable. IMPRESSION: Twin live intrauterine pregnancy. Electronically Signed   By: 09/24/2020 M.D.   On: 02/12/2020 20:21   02/14/2020 OB LESS THAN 14 WEEKS WITH OB TRANSVAGINAL  Result Date: 02/12/2020 CLINICAL DATA:  24 year old pregnant female with vaginal bleeding. LMP: 12/18/2019 corresponding to an estimated gestational age of [redacted] weeks, 0 days. EXAM:  TWIN OBSTETRIC <14WK [place] AND TRANSVAGINAL OB US COMPARISON:  Ultrasound dated 02/02/2020. FINDINGS: Number of IUPs:  2 Chorionicity/Amnionicity:  Monochorionic-diamniotic (thin membrane) TWIN 1 Yolk sac:  Seen Embryo:  Present Cardiac Activity: Detected Heart Rate: 158 bpm CRL: 19 mm   8 w 3 d                  [place] EDC: 09/20/2020 TWIN 2 Yolk sac:  Seen Embryo:  Detected Cardiac Activity: Present Heart Rate: 147 bpm CRL: 17 mm   8 w 1 d                  [place] EDC: 09/22/2020 Subchorionic hemorrhage: Small subchorionic hemorrhage measuring up to 15 mm. Maternal uterus/adnexae: The maternal ovaries are unremarkable. IMPRESSION: Twin live intrauterine pregnancy. Electronically Signed   By: Anner Crete M.D.   On: 02/12/2020 20:21    ____________________________________________    PROCEDURES  Procedure(s) performed:     Procedures     Medications - No data to display   ____________________________________________   INITIAL IMPRESSION / ASSESSMENT AND PLAN / ED COURSE  Pertinent labs & imaging results that were available during my care of the patient were reviewed by me and considered in my medical decision making (see chart for details).      Assessment and plan First trimester vaginal bleeding 24 year old female presents to the  emergency department after vaginal bleeding and cramping that started tonight.  Vital signs were reassuring at triage.  On physical exam, patient was resting comfortably.  Abdomen was soft and nontender without guarding.   CBC and CMP were reassuring.  Patient was O+.  Pelvic ultrasound indicated live twin intrauterine pregnancy with fetal heart tones at 158 and 147 bpm.  Pelvic ultrasound did indicate a small subchorionic hemorrhage.  Urinalysis showed a rare amount of bacteria but no other concerning findings other than expected large amount of blood.  Patient education regarding subchorionic hemorrhage was given to patient.  She was advised to take it easy over the next 1 to 2 days and patient was given a work note.  She was started on Keflex for asymptomatic bacteriuria in pregnancy.  She was advised to follow-up with OB/GYN.  Return precautions were given to return with new or worsening symptoms.  All patient questions were answered.   ____________________________________________  FINAL CLINICAL IMPRESSION(S) / ED DIAGNOSES  Final diagnoses:  Vaginal bleeding      NEW MEDICATIONS STARTED DURING THIS VISIT:  ED Discharge Orders    None          This chart was dictated using voice recognition software/Dragon. Despite best efforts to proofread, errors can occur which can change the meaning. Any change was purely unintentional.     Lannie Fields, PA-C 02/12/20 2248    Delman Kitten, MD 02/13/20 [phone]

## 2020-02-12 NOTE — ED Triage Notes (Signed)
Pt states almost [redacted] weeks pregnant. Felt a "big gush of blood" earlier. States some bleeding since. C/o low back pain. States red in color. Denies blood clots.   3 pregnancies, 1 ectopic pregnancy. Already had US performed. Sees [facility].

## 2020-02-12 NOTE — ED Notes (Signed)
NAD noted at this time, pt visualized sitting in chair reading a book at this time, apologized and explained delay, explained awaiting blood work and [place] was pending. Pt states understanding at this time.

## 2020-02-12 NOTE — ED Notes (Signed)
Patient is going to the hallway restroom to void.

## 2020-02-16 ENCOUNTER — Ambulatory Visit (INDEPENDENT_AMBULATORY_CARE_PROVIDER_SITE_OTHER): Payer: Medicaid Other | Admitting: Obstetrics and Gynecology

## 2020-02-16 ENCOUNTER — Other Ambulatory Visit: Payer: Self-pay

## 2020-02-16 ENCOUNTER — Encounter: Payer: Self-pay | Admitting: Obstetrics and Gynecology

## 2020-02-16 VITALS — BP 119/74 | HR 72 | Ht 65.0 in | Wt 205.4 lb

## 2020-02-16 DIAGNOSIS — O468X1 Other antepartum hemorrhage, first trimester: Secondary | ICD-10-CM

## 2020-02-16 DIAGNOSIS — O209 Hemorrhage in early pregnancy, unspecified: Secondary | ICD-10-CM

## 2020-02-16 DIAGNOSIS — O418X10 Other specified disorders of amniotic fluid and membranes, first trimester, not applicable or unspecified: Secondary | ICD-10-CM

## 2020-02-16 NOTE — Progress Notes (Unsigned)
HPI:      [name] is a 24 y.o. G3P1011 who LMP was Patient's last menstrual period was 12/18/2019 (approximate).  Subjective:   She presents today ***    Hx: The following portions of the patient's history were reviewed and updated as appropriate:             {Past history links:20407}       Review of Systems:  Review of Systems  Constitutional: Denied constitutional symptoms, night sweats, recent illness, fatigue, fever, insomnia and weight loss.  Eyes: Denied eye symptoms, eye pain, photophobia, vision change and visual disturbance.  Ears/Nose/Throat/Neck: Denied ear, nose, throat or neck symptoms, hearing loss, nasal discharge, sinus congestion and sore throat.  Cardiovascular: Denied cardiovascular symptoms, arrhythmia, chest pain/pressure, edema, exercise intolerance, orthopnea and palpitations.  Respiratory: Denied pulmonary symptoms, asthma, pleuritic pain, productive sputum, cough, dyspnea and wheezing.  Gastrointestinal: Denied, gastro-esophageal reflux, melena, nausea and vomiting.  Genitourinary:*** Denied genitourinary symptoms including symptomatic vaginal discharge, pelvic relaxation issues, and urinary complaints.  Musculoskeletal: Denied musculoskeletal symptoms, stiffness, swelling, muscle weakness and myalgia.  Dermatologic: Denied dermatology symptoms, rash and scar.  Neurologic: Denied neurology symptoms, dizziness, headache, neck pain and syncope.  Psychiatric: Denied psychiatric symptoms, anxiety and depression.  Endocrine: Denied endocrine symptoms including hot flashes and night sweats.   Meds:   Current Outpatient Medications on File Prior to Visit  Medication Sig Dispense Refill  . Prenatal Vit-Fe Fumarate-FA (PRENATAL MULTIVITAMIN) TABS tablet Take 1 tablet by mouth daily at 12 noon.     No current facility-administered medications on file prior to visit.    Objective:     There were no vitals filed for this visit.             ***  Assessment:    G3P1011 Patient Active Problem List   Diagnosis Date Noted  . Irregular menstrual cycle 06/29/2018  . Obesity, unspecified 11/03/2017  . Pilonidal cyst without abscess   . Pilonidal cyst with abscess 09/26/2017  . 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 Zone II fracture of sacrum (HCC) 03/14/2015  . 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 Bleeding in early pregnancy   2. Missed menses   3. Subchorionic hematoma in first trimester, single or unspecified fetus     ***   Plan:            1.  *** Orders No orders of the defined types were placed in this encounter.   No orders of the defined types were placed in this encounter.     F/U  No follow-ups on file.  [name], M.D. 02/16/2020 3:06 PM

## 2020-02-16 NOTE — Progress Notes (Signed)
HPI:      [name] is a 24 y.o. G3P1011 who LMP was Patient's last menstrual period was 12/18/2019 (approximate).  Subjective:   She presents today after being seen in the emergency department for vaginal bleeding.  She states she had a large gush of blood followed by spotting.  Since that time she has had irregular spotting but nothing significant.  Denies pelvic cramping or passage of tissue. Previous ultrasound reveals small subchorionic hemorrhage.  Twin pregnancy.  Blood type O+. Patient reports no other complications in the first trimester of pregnancy at this time.    Hx: The following portions of the patient's history were reviewed and updated as appropriate:             She  has a past medical history of GERD (gastroesophageal reflux disease) and History of asthma. She does not have any pertinent problems on file. She  has a past surgical history that includes Bony pelvis surgery; Ectopic pregnancy surgery; Pilonidal cyst excision (N/A, 10/07/2017); Salpingectomy (Left); and Hip fracture surgery (Left). Her family history includes Asthma in her mother; Breast cancer in her paternal aunt; Diabetes in her maternal grandfather and maternal grandmother; Heart disease in her mother. She  reports that she has never smoked. She has never used smokeless tobacco. She reports previous alcohol use of about 2.0 standard drinks of alcohol per week. She reports that she does not use drugs. She has a current medication list which includes the following prescription(s): cephalexin and prenatal multivitamin. She has No Known Allergies.       Review of Systems:  Review of Systems  Constitutional: Denied constitutional symptoms, night sweats, recent illness, fatigue, fever, insomnia and weight loss.  Eyes: Denied eye symptoms, eye pain, photophobia, vision change and visual disturbance.  Ears/Nose/Throat/Neck: Denied ear, nose, throat or neck symptoms, hearing loss, nasal discharge, sinus  congestion and sore throat.  Cardiovascular: Denied cardiovascular symptoms, arrhythmia, chest pain/pressure, edema, exercise intolerance, orthopnea and palpitations.  Respiratory: Denied pulmonary symptoms, asthma, pleuritic pain, productive sputum, cough, dyspnea and wheezing.  Gastrointestinal: Denied, gastro-esophageal reflux, melena, nausea and vomiting.  Genitourinary: See HPI for additional information.  Musculoskeletal: Denied musculoskeletal symptoms, stiffness, swelling, muscle weakness and myalgia.  Dermatologic: Denied dermatology symptoms, rash and scar.  Neurologic: Denied neurology symptoms, dizziness, headache, neck pain and syncope.  Psychiatric: Denied psychiatric symptoms, anxiety and depression.  Endocrine: Denied endocrine symptoms including hot flashes and night sweats.   Meds:   Current Outpatient Medications on File Prior to Visit  Medication Sig Dispense Refill  . cephALEXin (KEFLEX) 500 MG capsule Take 1 capsule (500 mg total) by mouth 3 (three) times daily for 7 days. 21 capsule 0  . Prenatal Vit-Fe Fumarate-FA (PRENATAL MULTIVITAMIN) TABS tablet Take 1 tablet by mouth daily at 12 noon.     No current facility-administered medications on file prior to visit.    Objective:     Vitals:   02/16/20 1442  BP: 119/74  Pulse: 72              ED report reviewed-ultrasounds reviewed.  Assessment:    G3P1011 Patient Active Problem List   Diagnosis Date Noted  . Irregular menstrual cycle 06/29/2018  . Obesity, unspecified 11/03/2017  . Pilonidal cyst without abscess   . Pilonidal cyst with abscess 09/26/2017  . 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 Zone II fracture of sacrum (HCC) 03/14/2015  . 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 Subchorionic hematoma in  first trimester, single or unspecified fetus   2. Vaginal bleeding in pregnancy, first trimester     Patient having minimal bleeding at this time.  Viable twin pregnancy as noted at  ultrasound.   Plan:            1.  Subchorionic hematoma discussed and described in detail the patient.  Expect occasional small amounts of bleeding.  Patient to present to this office or ED if bleeding becomes very heavy again. Nurse visit and new OB visit as scheduled. Orders No orders of the defined types were placed in this encounter.   No orders of the defined types were placed in this encounter.     F/U  No follow-ups on file. I spent 22 minutes involved in the care of this patient preparing to see the patient by obtaining and reviewing her medical history (including labs, imaging tests and prior procedures), documenting clinical information in the electronic health record (EHR), counseling and coordinating care plans, writing and sending prescriptions, ordering tests or procedures and directly communicating with the patient by discussing pertinent items from her history and physical exam as well as detailing my assessment and plan as noted above so that she has an informed understanding.  All of her questions were answered.  Finis Bud, M.D. 02/16/2020 3:11 PM

## 2020-02-25 ENCOUNTER — Telehealth: Payer: Self-pay | Admitting: Family Medicine

## 2020-03-01 NOTE — Progress Notes (Signed)
      Alexis Clark presents for NOB nurse intake visit. Pregnancy confirmation done at [facility], 01/18/2020, with Doreene Burke.  G 3.  P 1011.  LMP 12/18/2019.  EDD Baby A 09/22/2020    Baby B 09/24/2020.  Ga [id number]. Pregnancy education material explained and given.  0 cats in the home.  NOB labs ordered. BMI greater than 30. TSH/HbgA1c ordered. Sickle cell order due to race. HIV and drug screen explained and ordered. Genetic screening discussed. Genetic testing; Unsure. Pt to discuss genetic testing with provider. PNV encouraged. Pt to follow up with provider in 2 weeks for NOB physical.  [facility] Financial Policy reviewed with patient along with FMLA form signed.   Pt requested to see providers.

## 2020-03-02 ENCOUNTER — Other Ambulatory Visit: Payer: Self-pay

## 2020-03-02 ENCOUNTER — Ambulatory Visit (INDEPENDENT_AMBULATORY_CARE_PROVIDER_SITE_OTHER): Payer: Self-pay

## 2020-03-02 VITALS — BP 107/68 | HR 78 | Ht 65.0 in | Wt 204.1 lb

## 2020-03-02 DIAGNOSIS — Z3A10 10 weeks gestation of pregnancy: Secondary | ICD-10-CM

## 2020-03-02 DIAGNOSIS — R638 Other symptoms and signs concerning food and fluid intake: Secondary | ICD-10-CM

## 2020-03-02 DIAGNOSIS — Z3481 Encounter for supervision of other normal pregnancy, first trimester: Secondary | ICD-10-CM

## 2020-03-02 DIAGNOSIS — Z113 Encounter for screening for infections with a predominantly sexual mode of transmission: Secondary | ICD-10-CM

## 2020-03-02 DIAGNOSIS — Z0283 Encounter for blood-alcohol and blood-drug test: Secondary | ICD-10-CM

## 2020-03-02 NOTE — Patient Instructions (Signed)
First Trimester of Pregnancy  The first trimester of pregnancy is from week 1 until the end of week 13 (months 1 through 3). During this time, your baby will begin to develop inside you. At 6-8 weeks, the eyes and face are formed, and the heartbeat can be seen on ultrasound. At the end of 12 weeks, all the baby's organs are formed. Prenatal care is all the medical care you receive before the birth of your baby. Make sure you get good prenatal care and follow all of your doctor's instructions. Follow these instructions at home: Medicines  Take over-the-counter and prescription medicines only as told by your doctor. Some medicines are safe and some medicines are not safe during pregnancy.  Take a prenatal vitamin that contains at least 600 micrograms (mcg) of folic acid.  If you have trouble pooping (constipation), take medicine that will make your stool soft (stool softener) if your doctor approves. Eating and drinking   Eat regular, healthy meals.  Your doctor will tell you the amount of weight gain that is right for you.  Avoid raw meat and uncooked cheese.  If you feel sick to your stomach (nauseous) or throw up (vomit): ? Eat 4 or 5 small meals a day instead of 3 large meals. ? Try eating a few soda crackers. ? Drink liquids between meals instead of during meals.  To prevent constipation: ? Eat foods that are high in fiber, like fresh fruits and vegetables, whole grains, and beans. ? Drink enough fluids to keep your pee (urine) clear or pale yellow. Activity  Exercise only as told by your doctor. Stop exercising if you have cramps or pain in your lower belly (abdomen) or low back.  Do not exercise if it is too hot, too humid, or if you are in a place of great height (high altitude).  Try to avoid standing for long periods of time. Move your legs often if you must stand in one place for a long time.  Avoid heavy lifting.  Wear low-heeled shoes. Sit and stand up  straight.  You can have sex unless your doctor tells you not to. Relieving pain and discomfort  Wear a good support bra if your breasts are sore.  Take warm water baths (sitz baths) to soothe pain or discomfort caused by hemorrhoids. Use hemorrhoid cream if your doctor says it is okay.  Rest with your legs raised if you have leg cramps or low back pain.  If you have puffy, bulging veins (varicose veins) in your legs: ? Wear support hose or compression stockings as told by your doctor. ? Raise (elevate) your feet for 15 minutes, 3-4 times a day. ? Limit salt in your food. Prenatal care  Schedule your prenatal visits by the twelfth week of pregnancy.  Write down your questions. Take them to your prenatal visits.  Keep all your prenatal visits as told by your doctor. This is important. Safety  Wear your seat belt at all times when driving.  Make a list of emergency phone numbers. The list should include numbers for family, friends, the hospital, and police and fire departments. General instructions  Ask your doctor for a referral to a local prenatal class. Begin classes no later than at the start of month 6 of your pregnancy.  Ask for help if you need counseling or if you need help with nutrition. Your doctor can give you advice or tell you where to go for help.  Do not use hot tubs, steam  rooms, or saunas.  Do not douche or use tampons or scented sanitary pads.  Do not cross your legs for long periods of time.  Avoid all herbs and alcohol. Avoid drugs that are not approved by your doctor.  Do not use any tobacco products, including cigarettes, chewing tobacco, and electronic cigarettes. If you need help quitting, ask your doctor. You may get counseling or other support to help you quit.  Avoid cat litter boxes and soil used by cats. These carry germs that can cause birth defects in the baby and can cause a loss of your baby (miscarriage) or stillbirth.  Visit your dentist.  At home, brush your teeth with a soft toothbrush. Be gentle when you floss. Contact a doctor if:  You are dizzy.  You have mild cramps or pressure in your lower belly.  You have a nagging pain in your belly area.  You continue to feel sick to your stomach, you throw up, or you have watery poop (diarrhea).  You have a bad smelling fluid coming from your vagina.  You have pain when you pee (urinate).  You have increased puffiness (swelling) in your face, hands, legs, or ankles. Get help right away if:  You have a fever.  You are leaking fluid from your vagina.  You have spotting or bleeding from your vagina.  You have very bad belly cramping or pain.  You gain or lose weight rapidly.  You throw up blood. It may look like coffee grounds.  You are around people who have [place] measles, fifth disease, or chickenpox.  You have a very bad headache.  You have shortness of breath.  You have any kind of trauma, such as from a fall or a car accident. Summary  The first trimester of pregnancy is from week 1 until the end of week 13 (months 1 through 3).  To take care of yourself and your unborn baby, you will need to eat healthy meals, take medicines only if your doctor tells you to do so, and do activities that are safe for you and your baby.  Keep all follow-up visits as told by your doctor. This is important as your doctor will have to ensure that your baby is healthy and growing well. This information is not intended to replace advice given to you by your health care provider. Make sure you discuss any questions you have with your health care provider. Document Revised: 12/03/2018 Document Reviewed: 08/20/2016 Elsevier Patient Education  [place]. WHAT OB PATIENTS CAN EXPECT   Confirmation of pregnancy and ultrasound ordered if medically indicated-[redacted] weeks gestation  New OB (NOB) intake with nurse and New OB (NOB) labs- [redacted] weeks gestation  New OB (NOB) physical  examination with provider- 11/[redacted] weeks gestation  Flu vaccine-[redacted] weeks gestation  Anatomy scan-[redacted] weeks gestation  Glucose tolerance test, blood work to test for anemia, T-dap vaccine-[redacted] weeks gestation  Vaginal swabs/cultures-STD/Group B strep-[redacted] weeks gestation  Appointments every 4 weeks until 28 weeks  Every 2 weeks from 28 weeks until 36 weeks  Weekly visits from 36 weeks until delivery  Second Trimester of Pregnancy  The second trimester is from week 14 through week 27 (month 4 through 6). This is often the time in pregnancy that you feel your best. Often times, morning sickness has lessened or quit. You may have more energy, and you may get hungry more often. Your unborn baby is growing rapidly. At the end of the sixth month, he or she is  about 9 inches long and weighs about 1 pounds. You will likely feel the baby move between 18 and 20 weeks of pregnancy. Follow these instructions at home: Medicines  Take over-the-counter and prescription medicines only as told by your doctor. Some medicines are safe and some medicines are not safe during pregnancy.  Take a prenatal vitamin that contains at least 600 micrograms (mcg) of folic acid.  If you have trouble pooping (constipation), take medicine that will make your stool soft (stool softener) if your doctor approves. Eating and drinking   Eat regular, healthy meals.  Avoid raw meat and uncooked cheese.  If you get low calcium from the food you eat, talk to your doctor about taking a daily calcium supplement.  Avoid foods that are high in fat and sugars, such as fried and sweet foods.  If you feel sick to your stomach (nauseous) or throw up (vomit): ? Eat 4 or 5 small meals a day instead of 3 large meals. ? Try eating a few soda crackers. ? Drink liquids between meals instead of during meals.  To prevent constipation: ? Eat foods that are high in fiber, like fresh fruits and vegetables, whole grains, and beans. ? Drink  enough fluids to keep your pee (urine) clear or pale yellow. Activity  Exercise only as told by your doctor. Stop exercising if you start to have cramps.  Do not exercise if it is too hot, too humid, or if you are in a place of great height (high altitude).  Avoid heavy lifting.  Wear low-heeled shoes. Sit and stand up straight.  You can continue to have sex unless your doctor tells you not to. Relieving pain and discomfort  Wear a good support bra if your breasts are tender.  Take warm water baths (sitz baths) to soothe pain or discomfort caused by hemorrhoids. Use hemorrhoid cream if your doctor approves.  Rest with your legs raised if you have leg cramps or low back pain.  If you develop puffy, bulging veins (varicose veins) in your legs: ? Wear support hose or compression stockings as told by your doctor. ? Raise (elevate) your feet for 15 minutes, 3-4 times a day. ? Limit salt in your food. Prenatal care  Write down your questions. Take them to your prenatal visits.  Keep all your prenatal visits as told by your doctor. This is important. Safety  Wear your seat belt when driving.  Make a list of emergency phone numbers, including numbers for family, friends, the hospital, and police and fire departments. General instructions  Ask your doctor about the right foods to eat or for help finding a counselor, if you need these services.  Ask your doctor about local prenatal classes. Begin classes before month 6 of your pregnancy.  Do not use hot tubs, steam rooms, or saunas.  Do not douche or use tampons or scented sanitary pads.  Do not cross your legs for long periods of time.  Visit your dentist if you have not done so. Use a soft toothbrush to brush your teeth. Floss gently.  Avoid all smoking, herbs, and alcohol. Avoid drugs that are not approved by your doctor.  Do not use any products that contain nicotine or tobacco, such as cigarettes and e-cigarettes. If you  need help quitting, ask your doctor.  Avoid cat litter boxes and soil used by cats. These carry germs that can cause birth defects in the baby and can cause a loss of your baby (miscarriage) or stillbirth.  Contact a doctor if:  You have mild cramps or pressure in your lower belly.  You have pain when you pee (urinate).  You have bad smelling fluid coming from your vagina.  You continue to feel sick to your stomach (nauseous), throw up (vomit), or have watery poop (diarrhea).  You have a nagging pain in your belly area.  You feel dizzy. Get help right away if:  You have a fever.  You are leaking fluid from your vagina.  You have spotting or bleeding from your vagina.  You have severe belly cramping or pain.  You lose or gain weight rapidly.  You have trouble catching your breath and have chest pain.  You notice sudden or extreme puffiness (swelling) of your face, hands, ankles, feet, or legs.  You have not felt the baby move in over an hour.  You have severe headaches that do not go away when you take medicine.  You have trouble seeing. Summary  The second trimester is from week 14 through week 27 (months 4 through 6). This is often the time in pregnancy that you feel your best.  To take care of yourself and your unborn baby, you will need to eat healthy meals, take medicines only if your doctor tells you to do so, and do activities that are safe for you and your baby.  Call your doctor if you get sick or if you notice anything unusual about your pregnancy. Also, call your doctor if you need help with the right food to eat, or if you want to know what activities are safe for you. This information is not intended to replace advice given to you by your health care provider. Make sure you discuss any questions you have with your health care provider. Document Revised: 12/04/2018 Document Reviewed: 09/17/2016 Elsevier Patient Education  2020 [employer]. How a Baby Grows  During Pregnancy  Pregnancy begins when a female's sperm enters a female's egg (fertilization). Fertilization usually happens in one of the tubes (fallopian tubes) that connect the ovaries to the womb (uterus). The fertilized egg moves down the fallopian tube to the uterus. Once it reaches the uterus, it implants into the lining of the uterus and begins to grow. For the first 10 weeks, the fertilized egg is called an embryo. After 10 weeks, it is called a fetus. As the fetus continues to grow, it receives oxygen and nutrients through tissue (placenta) that grows to support the developing baby. The placenta is the life support system for the baby. It provides oxygen and nutrition and removes waste. Learning as much as you can about your pregnancy and how your baby is developing can help you enjoy the experience. It can also make you aware of when there might be a problem and when to ask questions. How long does a typical pregnancy last? A pregnancy usually lasts 280 days, or about 40 weeks. Pregnancy is divided into three periods of growth, also called trimesters:  First trimester: 0-12 weeks.  Second trimester: 13-27 weeks.  Third trimester: 28-40 weeks. The day when your baby is ready to be born (full term) is your estimated date of delivery. How does my baby develop month by month? First month  The fertilized egg attaches to the inside of the uterus.  Some cells will form the placenta. Others will form the fetus.  The arms, legs, brain, spinal cord, lungs, and heart begin to develop.  At the end of the first month, the heart begins to beat.  Second month  The bones, inner ear, eyelids, hands, and feet form.  The genitals develop.  By the end of 8 weeks, all major organs are developing. Third month  All of the internal organs are forming.  Teeth develop below the gums.  Bones and muscles begin to grow. The spine can flex.  The skin is transparent.  Fingernails and toenails  begin to form.  Arms and legs continue to grow longer, and hands and feet develop.  The fetus is about 3 inches (7.6 cm) long. Fourth month  The placenta is completely formed.  The external sex organs, neck, outer ear, eyebrows, eyelids, and fingernails are formed.  The fetus can hear, swallow, and move its arms and legs.  The kidneys begin to produce urine.  The skin is covered with a white, waxy coating (vernix) and very fine hair (lanugo). Fifth month  The fetus moves around more and can be felt for the first time (quickening).  The fetus starts to sleep and wake up and may begin to suck its finger.  The nails grow to the end of the fingers.  The organ in the digestive system that makes bile (gallbladder) functions and helps to digest nutrients.  If your baby is a girl, eggs are present in her ovaries. If your baby is a boy, testicles start to move down into his scrotum. Sixth month  The lungs are formed.  The eyes open. The brain continues to develop.  Your baby has fingerprints and toe prints. Your baby's hair grows thicker.  At the end of the second trimester, the fetus is about 9 inches (22.9 cm) long. Seventh month  The fetus kicks and stretches.  The eyes are developed enough to sense changes in light.  The hands can make a grasping motion.  The fetus responds to sound. Eighth month  All organs and body systems are fully developed and functioning.  Bones harden, and taste buds develop. The fetus may hiccup.  Certain areas of the brain are still developing. The skull remains soft. Ninth month  The fetus gains about  lb (0.23 kg) each week.  The lungs are fully developed.  Patterns of sleep develop.  The fetus's head typically moves into a head-down position (vertex) in the uterus to prepare for birth.  The fetus weighs 6-9 lb (2.72-4.08 kg) and is 19-20 inches (48.26-50.8 cm) long. What can I do to have a healthy pregnancy and help my baby  develop? General instructions  Take prenatal vitamins as directed by your health care provider. These include vitamins such as folic acid, iron, calcium, and vitamin D. They are important for healthy development.  Take medicines only as directed by your health care provider. Read labels and ask a pharmacist or your health care provider whether over-the-counter medicines, supplements, and prescription drugs are safe to take during pregnancy.  Keep all follow-up visits as directed by your health care provider. This is important. Follow-up visits include prenatal care and screening tests. How do I know if my baby is developing well? At each prenatal visit, your health care provider will do several different tests to check on your health and keep track of your baby's development. These include:  Fundal height and position. ? Your health care provider will measure your growing belly from your pubic bone to the top of the uterus using a tape measure. ? Your health care provider will also feel your belly to determine your baby's position.  Heartbeat. ? An ultrasound in the  first trimester can confirm pregnancy and show a heartbeat, depending on how far along you are. ? Your health care provider will check your baby's heart rate at every prenatal visit.  Second trimester ultrasound. ? This ultrasound checks your baby's development. It also may show your baby's gender. What should I do if I have concerns about my baby's development? Always talk with your health care provider about any concerns that you may have about your pregnancy and your baby. Summary  A pregnancy usually lasts 280 days, or about 40 weeks. Pregnancy is divided into three periods of growth, also called trimesters.  Your health care provider will monitor your baby's growth and development throughout your pregnancy.  Follow your health care provider's recommendations about taking prenatal vitamins and medicines during your  pregnancy.  Talk with your health care provider if you have any concerns about your pregnancy or your developing baby. This information is not intended to replace advice given to you by your health care provider. Make sure you discuss any questions you have with your health care provider. Document Revised: 12/03/2018 Document Reviewed: 06/25/2017 Elsevier Patient Education  2020 [employer]. Commonly Asked Questions During Pregnancy  Cats: A parasite can be excreted in cat feces.  To avoid exposure you need to have another person empty the little box.  If you must empty the litter box you will need to wear gloves.  Wash your hands after handling your cat.  This parasite can also be found in raw or undercooked meat so this should also be avoided.  Colds, Sore Throats, Flu: Please check your medication sheet to see what you can take for symptoms.  If your symptoms are unrelieved by these medications please call the office.  Dental Work: Most any dental work Investment banker, corporate recommends is permitted.  X-rays should only be taken during the first trimester if absolutely necessary.  Your abdomen should be shielded with a lead apron during all x-rays.  Please notify your provider prior to receiving any x-rays.  Novocaine is fine; gas is not recommended.  If your dentist requires a note from [place] prior to dental work please call the office and we will provide one for you.  Exercise: Exercise is an important part of staying healthy during your pregnancy.  You may continue most exercises you were accustomed to prior to pregnancy.  Later in your pregnancy you will most likely notice you have difficulty with activities requiring balance like riding a bicycle.  It is important that you listen to your body and avoid activities that put you at a higher risk of falling.  Adequate rest and staying well hydrated are a must!  If you have questions about the safety of specific activities ask your provider.    Exposure to  Children with illness: Try to avoid obvious exposure; report any symptoms to [place] when noted,  If you have chicken pos, red measles or mumps, you should be immune to these diseases.   Please do not take any vaccines while pregnant unless you have checked with your OB provider.  Fetal Movement: After 28 weeks we recommend you do "kick counts" twice daily.  Lie or sit down in a calm quiet environment and count your baby movements "kicks".  You should feel your baby at least 10 times per hour.  If you have not felt 10 kicks within the first hour get up, walk around and have something sweet to eat or drink then repeat for an additional hour.  If  count remains less than 10 per hour notify your provider.  Fumigating: Follow your pest control agent's advice as to how long to stay out of your home.  Ventilate the area well before re-entering.  Hemorrhoids:   Most over-the-counter preparations can be used during pregnancy.  Check your medication to see what is safe to use.  It is important to use a stool softener or fiber in your diet and to drink lots of liquids.  If hemorrhoids seem to be getting worse please call the office.   Hot Tubs:  Hot tubs Jacuzzis and saunas are not recommended while pregnant.  These increase your internal body temperature and should be avoided.  Intercourse:  Sexual intercourse is safe during pregnancy as long as you are comfortable, unless otherwise advised by your provider.  Spotting may occur after intercourse; report any bright red bleeding that is heavier than spotting.  Labor:  If you know that you are in labor, please go to the hospital.  If you are unsure, please call the office and let us help you decide what to do.  Lifting, straining, etc:  If your job requires heavy lifting or straining please check with your provider for any limitations.  Generally, you should not lift items heavier than that you can lift simply with your hands and arms (no back muscles)  Painting:   Paint fumes do not harm your pregnancy, but may make you ill and should be avoided if possible.  Latex or water based paints have less odor than oils.  Use adequate ventilation while painting.  Permanents & Hair Color:  Chemicals in hair dyes are not recommended as they cause increase hair dryness which can increase hair loss during pregnancy.  " Highlighting" and permanents are allowed.  Dye may be absorbed differently and permanents may not hold as well during pregnancy.  Sunbathing:  Use a sunscreen, as skin burns easily during pregnancy.  Drink plenty of fluids; avoid over heating.  Tanning Beds:  Because their possible side effects are still unknown, tanning beds are not recommended.  Ultrasound Scans:  Routine ultrasounds are performed at approximately 20 weeks.  You will be able to see your baby's general anatomy an if you would like to know the gender this can usually be determined as well.  If it is questionable when you conceived you may also receive an ultrasound early in your pregnancy for dating purposes.  Otherwise ultrasound exams are not routinely performed unless there is a medical necessity.  Although you can request a scan we ask that you pay for it when conducted because insurance does not cover " patient request" scans.  Work: If your pregnancy proceeds without complications you may work until your due date, unless your physician or employer advises otherwise.  Round Ligament Pain/Pelvic Discomfort:  Sharp, shooting pains not associated with bleeding are fairly common, usually occurring in the second trimester of pregnancy.  They tend to be worse when standing up or when you remain standing for long periods of time.  These are the result of pressure of certain pelvic ligaments called "round ligaments".  Rest, Tylenol and heat seem to be the most effective relief.  As the womb and fetus grow, they rise out of the pelvis and the discomfort improves.  Please notify the office if your  pain seems different than that described.  It may represent a more serious condition.  Common Medications Safe in Pregnancy  Acne:      Constipation:  Benzoyl Peroxide  Colace  Clindamycin      Dulcolax Suppository  Topica Erythromycin     Fibercon  Salicylic Acid      Metamucil         Miralax AVOID:        Senakot   Accutane    Cough:  Retin-A       Cough Drops  Tetracycline      Phenergan w/ Codeine if Rx  Minocycline      Robitussin (Plain & DM)  Antibiotics:     Crabs/Lice:  Ceclor       RID  Cephalosporins    AVOID:  E-Mycins      Kwell  Keflex  Macrobid/Macrodantin   Diarrhea:  Penicillin      Kao-Pectate  Zithromax      Imodium AD         PUSH FLUIDS AVOID:       Cipro     Fever:  Tetracycline      Tylenol (Regular or Extra  Minocycline       Strength)  Levaquin      Extra Strength-Do not          Exceed 8 tabs/24 hrs Caffeine:        <266m/day (equiv. To 1 cup of coffee or  approx. 3 12 oz sodas)         Gas: Cold/Hayfever:       Gas-X  Benadryl      Mylicon  Claritin       Phazyme  **Claritin-D        Chlor-Trimeton    Headaches:  Dimetapp      ASA-Free Excedrin  Drixoral-Non-Drowsy     Cold Compress  Mucinex (Guaifenasin)     Tylenol (Regular or Extra  Sudafed/Sudafed-12 Hour     Strength)  **Sudafed PE Pseudoephedrine   Tylenol Cold & Sinus     Vicks Vapor Rub  Zyrtec  **AVOID if Problems With Blood Pressure         Heartburn: Avoid lying down for at least 1 hour after meals  Aciphex      Maalox     Rash:  Milk of Magnesia     Benadryl    Mylanta       1% Hydrocortisone Cream  Pepcid  Pepcid Complete   Sleep Aids:  Prevacid      Ambien   Prilosec       Benadryl  Rolaids       Chamomile Tea  Tums (Limit 4/day)     Unisom  Zantac       Tylenol PM         Warm milk-add vanilla or  Hemorrhoids:       Sugar for taste  Anusol/Anusol H.C.  (RX: Analapram 2.5%)  Sugar Substitutes:  Hydrocortisone OTC     Ok in moderation  Preparation  H      Tucks        Vaseline lotion applied to tissue with wiping    Herpes:     Throat:  Acyclovir      Oragel  Famvir  Valtrex     Vaccines:         Flu Shot Leg Cramps:       *Gardasil  Benadryl      Hepatitis A         Hepatitis B Nasal Spray:       Pneumovax  Saline Nasal Spray     Polio Booster  Tetanus Nausea:       Tuberculosis test or PPD  Vitamin B6 25 mg TID   AVOID:    Dramamine      *Gardasil  Emetrol       Live Poliovirus  Ginger Root 250 mg QID    MMR (measles, mumps &  High Complex Carbs @ Bedtime    rebella)  Sea Bands-Accupressure    Varicella (Chickenpox)  Unisom 1/2 tab TID     *No known complications           If received before Pain:         Known pregnancy;   Darvocet       Resume series after  Lortab        Delivery  Percocet    Yeast:   Tramadol      Femstat  Tylenol 3      Gyne-lotrimin  Ultram       Monistat  Vicodin           MISC:         All Sunscreens           Hair Coloring/highlights          Insect Repellant's          (Including DEET)         Mystic Tans

## 2020-03-03 ENCOUNTER — Other Ambulatory Visit: Payer: Medicaid Other

## 2020-03-03 LAB — URINALYSIS, ROUTINE W REFLEX MICROSCOPIC
Bilirubin, UA: NEGATIVE
Glucose, UA: NEGATIVE
Ketones, UA: NEGATIVE
Nitrite, UA: NEGATIVE
Protein,UA: NEGATIVE
RBC, UA: NEGATIVE
Specific Gravity, UA: 1.021 (ref 1.005–1.030)
Urobilinogen, Ur: 0.2 mg/dL (ref 0.2–1.0)
pH, UA: 7 (ref 5.0–7.5)

## 2020-03-03 LAB — DRUG PROFILE, UR, 9 DRUGS (LABCORP)
Amphetamines, Urine: NEGATIVE ng/mL
Barbiturate Quant, Ur: NEGATIVE ng/mL
Benzodiazepine Quant, Ur: NEGATIVE ng/mL
Cannabinoid Quant, Ur: NEGATIVE ng/mL
Cocaine (Metab.): NEGATIVE ng/mL
Methadone Screen, Urine: NEGATIVE ng/mL
Opiate Quant, Ur: NEGATIVE ng/mL
PCP Quant, Ur: NEGATIVE ng/mL
Propoxyphene: NEGATIVE ng/mL

## 2020-03-03 LAB — NICOTINE SCREEN, URINE: Cotinine Ql Scrn, Ur: NEGATIVE ng/mL

## 2020-03-03 LAB — MICROSCOPIC EXAMINATION
Casts: NONE SEEN /lpf
Epithelial Cells (non renal): >10 /hpf — AB (ref 0–10)

## 2020-03-03 LAB — GC/CHLAMYDIA PROBE AMP
Chlamydia trachomatis, NAA: NEGATIVE
Neisseria Gonorrhoeae by PCR: NEGATIVE

## 2020-03-04 LAB — CULTURE, OB URINE

## 2020-03-04 LAB — URINE CULTURE, OB REFLEX

## 2020-03-06 LAB — VARICELLA ZOSTER ANTIBODY, IGG: Varicella zoster IgG: 636 index (ref >165)

## 2020-03-06 LAB — HEMOGLOBIN A1C
Est. average glucose Bld gHb Est-mCnc: 88 mg/dL
Hgb A1c MFr Bld: 4.7 % — ABNORMAL LOW (ref 4.8–5.6)

## 2020-03-06 LAB — ANTIBODY SCREEN: Antibody Screen: NEGATIVE

## 2020-03-06 LAB — TSH: TSH: 5.4 u[IU]/mL — ABNORMAL HIGH (ref 0.450–4.500)

## 2020-03-06 LAB — RPR: RPR Ser Ql: NONREACTIVE

## 2020-03-06 LAB — HIV ANTIBODY (ROUTINE TESTING W REFLEX): HIV Screen 4th Generation wRfx: NONREACTIVE

## 2020-03-06 LAB — HEPATITIS B SURFACE ANTIGEN: Hepatitis B Surface Ag: NEGATIVE

## 2020-03-06 LAB — HGB SOLU + RFLX FRAC: Sickle Solubility Test - HGBRFX: NEGATIVE

## 2020-03-06 LAB — RUBELLA SCREEN: Rubella Antibodies, IGG: 8.73 index (ref >0.99)

## 2020-03-14 ENCOUNTER — Other Ambulatory Visit: Payer: Self-pay

## 2020-03-14 ENCOUNTER — Encounter: Payer: Self-pay | Admitting: Obstetrics and Gynecology

## 2020-03-14 ENCOUNTER — Encounter: Payer: Medicaid Other | Admitting: Certified Nurse Midwife

## 2020-03-14 ENCOUNTER — Ambulatory Visit: Payer: Medicaid Other | Admitting: Obstetrics and Gynecology

## 2020-03-14 VITALS — BP 111/74 | HR 69 | Wt 203.3 lb

## 2020-03-14 DIAGNOSIS — O418X10 Other specified disorders of amniotic fluid and membranes, first trimester, not applicable or unspecified: Secondary | ICD-10-CM

## 2020-03-14 DIAGNOSIS — O468X1 Other antepartum hemorrhage, first trimester: Secondary | ICD-10-CM

## 2020-03-14 DIAGNOSIS — Z3492 Encounter for supervision of normal pregnancy, unspecified, second trimester: Secondary | ICD-10-CM

## 2020-03-14 DIAGNOSIS — Z3A12 12 weeks gestation of pregnancy: Secondary | ICD-10-CM

## 2020-03-14 NOTE — Progress Notes (Signed)
NOB: Patient states her spotting has resolved.  She continues to have a small amount of discharge.  Taking vitamins as directed.  Nausea without vomiting.  Patient desires genetic testing today.  aFP next visit.  Physical examination General NAD, Conversant  HEENT Atraumatic; Op clear with mmm.  Normo-cephalic. Pupils reactive. Anicteric sclerae  Thyroid/Neck Smooth without nodularity or enlargement. Normal ROM.  Neck Supple.  Skin No rashes, lesions or ulceration. Normal palpated skin turgor. No nodularity.  Breasts: No masses or discharge.  Symmetric.  No axillary adenopathy.  Lungs: Clear to auscultation.No rales or wheezes. Normal Respiratory effort, no retractions.  Heart: NSR.  No murmurs or rubs appreciated. No periferal edema  Abdomen: Soft.  Non-tender.  No masses.  No HSM. No hernia  Extremities: Moves all appropriately.  Normal ROM for age. No lymphadenopathy.  Neuro: Oriented to PPT.  Normal mood. Normal affect.     Pelvic:   Vulva: Normal appearance.  No lesions.  Vagina: No lesions or abnormalities noted.  Support: Normal pelvic support.  Urethra No masses tenderness or scarring.  Meatus Normal size without lesions or prolapse.  Cervix: Normal appearance.  No lesions.  Anus: Normal exam.  No lesions.  Perineum: Normal exam.  No lesions.        Bimanual   Adnexae: No masses.  Non-tender to palpation.  Uterus: Enlarged. 14wks Pos FHTs  Non-tender.  Mobile.  AV.  Adnexae: No masses.  Non-tender to palpation.  Cul-de-sac: Negative for abnormality.  Adnexae: No masses.  Non-tender to palpation.         Pelvimetry   Diagonal: Reached.  Spines: Average.  Sacrum: Concave.  Pubic Arch: Normal.

## 2020-03-15 LAB — CBC WITH DIFFERENTIAL/PLATELET
Basophils Absolute: 0 10*3/uL (ref 0.0–0.2)
Basos: 0 %
EOS (ABSOLUTE): 0.1 10*3/uL (ref 0.0–0.4)
Eos: 1 %
Hematocrit: 34.1 % (ref 34.0–46.6)
Hemoglobin: 11.8 g/dL (ref 11.1–15.9)
Immature Grans (Abs): 0 10*3/uL (ref 0.0–0.1)
Immature Granulocytes: 0 %
Lymphocytes Absolute: 1.3 10*3/uL (ref 0.7–3.1)
Lymphs: 16 %
MCH: 28.6 pg (ref 26.6–33.0)
MCHC: 34.6 g/dL (ref 31.5–35.7)
MCV: 83 fL (ref 79–97)
Monocytes Absolute: 0.6 10*3/uL (ref 0.1–0.9)
Monocytes: 8 %
Neutrophils Absolute: 5.7 10*3/uL (ref 1.4–7.0)
Neutrophils: 75 %
Platelets: 196 10*3/uL (ref 150–450)
RBC: 4.13 x10E6/uL (ref 3.77–5.28)
RDW: 13.3 % (ref 11.7–15.4)
WBC: 7.6 10*3/uL (ref 3.4–10.8)

## 2020-03-23 LAB — MATERNIT21  PLUS CORE+ESS+SCA, BLOOD

## 2020-03-24 ENCOUNTER — Other Ambulatory Visit: Payer: Self-pay | Admitting: Surgical

## 2020-03-24 DIAGNOSIS — Z3A12 12 weeks gestation of pregnancy: Secondary | ICD-10-CM

## 2020-03-24 DIAGNOSIS — Z3492 Encounter for supervision of normal pregnancy, unspecified, second trimester: Secondary | ICD-10-CM

## 2020-03-27 LAB — MATERNIT21  PLUS CORE+ESS, BLOOD
11q23 deletion (Jacobsen): NOT DETECTED
15q11 deletion (PW Angelman): NOT DETECTED
1p36 deletion syndrome: NOT DETECTED
22q11 deletion (DiGeorge): NOT DETECTED
4p16 deletion(Wolf-Hirschhorn): NOT DETECTED
5p15 deletion (Cri-du-chat): NOT DETECTED
8q24 deletion (Langer-Giedion): NOT DETECTED
Fetal Fraction: 9
Result (T21): NEGATIVE
Trisomy 13 (Patau syndrome): NEGATIVE
Trisomy 16: NOT DETECTED
Trisomy 18 (Edwards syndrome): NEGATIVE
Trisomy 21 (Down syndrome): NEGATIVE
Trisomy 22: NOT DETECTED

## 2020-04-11 ENCOUNTER — Other Ambulatory Visit: Payer: Self-pay

## 2020-04-11 ENCOUNTER — Encounter: Payer: Self-pay | Admitting: Obstetrics and Gynecology

## 2020-04-11 ENCOUNTER — Ambulatory Visit (INDEPENDENT_AMBULATORY_CARE_PROVIDER_SITE_OTHER): Payer: Self-pay | Admitting: Obstetrics and Gynecology

## 2020-04-11 VITALS — BP 108/70 | HR 63 | Wt 207.2 lb

## 2020-04-11 DIAGNOSIS — M549 Dorsalgia, unspecified: Secondary | ICD-10-CM

## 2020-04-11 DIAGNOSIS — O30042 Twin pregnancy, dichorionic/diamniotic, second trimester: Secondary | ICD-10-CM

## 2020-04-11 DIAGNOSIS — O99891 Other specified diseases and conditions complicating pregnancy: Secondary | ICD-10-CM

## 2020-04-11 DIAGNOSIS — Z3482 Encounter for supervision of other normal pregnancy, second trimester: Secondary | ICD-10-CM

## 2020-04-11 DIAGNOSIS — Z3A16 16 weeks gestation of pregnancy: Secondary | ICD-10-CM

## 2020-04-11 DIAGNOSIS — Z1379 Encounter for other screening for genetic and chromosomal anomalies: Secondary | ICD-10-CM

## 2020-04-11 DIAGNOSIS — R7989 Other specified abnormal findings of blood chemistry: Secondary | ICD-10-CM

## 2020-04-11 LAB — POCT URINALYSIS DIPSTICK OB
Bilirubin, UA: NEGATIVE
Blood, UA: NEGATIVE
Glucose, UA: NEGATIVE
Ketones, UA: NEGATIVE
Nitrite, UA: NEGATIVE
POC,PROTEIN,UA: NEGATIVE
Spec Grav, UA: <=1.005 — AB (ref 1.010–1.025)
Urobilinogen, UA: 0.2 E.U./dL
pH, UA: 7 (ref 5.0–8.0)

## 2020-04-11 NOTE — Progress Notes (Signed)
ROB-Pt present for routine prenatal care. Pt c/o of low back pain and sharp pain in the uterus area.

## 2020-04-11 NOTE — Progress Notes (Signed)
NOI:[id number] notes having some back pain. Discussed alleviating methods. S/p normal [id number].  For AFP today. Also to repeat thyroid panel (had abnormal TSH in first trimester). For anatomy scan next visit. RTC in 4 weeks.

## 2020-04-12 ENCOUNTER — Other Ambulatory Visit: Payer: Medicaid Other

## 2020-04-12 ENCOUNTER — Other Ambulatory Visit: Payer: Self-pay

## 2020-04-14 LAB — AFP, SERUM, OPEN SPINA BIFIDA
AFP MoM: 1.81
AFP Value: 44.8 ng/mL
Gest. Age on Collection Date: 16.1 weeks
Maternal Age At EDD: 25.1 yr
OSBR Risk 1 IN: 2507
Test Results:: NEGATIVE
Weight: 270 [lb_av]

## 2020-04-14 LAB — THYROID PANEL WITH TSH
Free Thyroxine Index: 1.9 (ref 1.2–4.9)
T3 Uptake Ratio: 13 % — ABNORMAL LOW (ref 24–39)
T4, Total: 14.6 ug/dL — ABNORMAL HIGH (ref 4.5–12.0)
TSH: 2.76 u[IU]/mL (ref 0.450–4.500)

## 2020-05-03 ENCOUNTER — Telehealth: Payer: Self-pay | Admitting: Obstetrics and Gynecology

## 2020-05-03 NOTE — Telephone Encounter (Signed)
Pt called in and stated that she missed a call. I told her I was to remind her about her next appt. Also I told her to please have a full bladder at her ultrasound

## 2020-05-04 ENCOUNTER — Other Ambulatory Visit: Payer: Self-pay

## 2020-05-04 ENCOUNTER — Ambulatory Visit (INDEPENDENT_AMBULATORY_CARE_PROVIDER_SITE_OTHER): Payer: Self-pay

## 2020-05-04 ENCOUNTER — Ambulatory Visit (INDEPENDENT_AMBULATORY_CARE_PROVIDER_SITE_OTHER): Payer: Self-pay | Admitting: Obstetrics and Gynecology

## 2020-05-04 ENCOUNTER — Other Ambulatory Visit: Payer: Medicaid Other

## 2020-05-04 VITALS — BP 93/57 | HR 76 | Wt 207.5 lb

## 2020-05-04 DIAGNOSIS — Z3482 Encounter for supervision of other normal pregnancy, second trimester: Secondary | ICD-10-CM

## 2020-05-04 DIAGNOSIS — O30042 Twin pregnancy, dichorionic/diamniotic, second trimester: Secondary | ICD-10-CM

## 2020-05-04 DIAGNOSIS — Z3A19 19 weeks gestation of pregnancy: Secondary | ICD-10-CM

## 2020-05-04 LAB — POCT URINALYSIS DIPSTICK OB
Bilirubin, UA: NEGATIVE
Blood, UA: NEGATIVE
Glucose, UA: NEGATIVE
Ketones, UA: NEGATIVE
Leukocytes, UA: NEGATIVE
Nitrite, UA: NEGATIVE
POC,PROTEIN,UA: NEGATIVE
Spec Grav, UA: 1.01 (ref 1.010–1.025)
Urobilinogen, UA: 0.2 E.U./dL
pH, UA: 5 (ref 5.0–8.0)

## 2020-05-04 NOTE — Progress Notes (Signed)
ROB: Patient had FAS today-normal (2 girls).  She complains of occasional lower back pain and some pelvic pressure pain.  She states that she will begin to use her belly band.  We discussed additional testing of twins in the third trimester as well as the possibility of early delivery.  We discussed the different presentations possible and what each presentation would mean for delivery.  Patient is taking prenatal vitamins as directed.

## 2020-05-29 ENCOUNTER — Other Ambulatory Visit: Payer: Self-pay

## 2020-05-29 ENCOUNTER — Ambulatory Visit (INDEPENDENT_AMBULATORY_CARE_PROVIDER_SITE_OTHER): Payer: Medicaid Other | Admitting: Obstetrics and Gynecology

## 2020-05-29 ENCOUNTER — Encounter: Payer: Self-pay | Admitting: Obstetrics and Gynecology

## 2020-05-29 VITALS — BP 101/65 | HR 76 | Wt 210.3 lb

## 2020-05-29 DIAGNOSIS — Z3A23 23 weeks gestation of pregnancy: Secondary | ICD-10-CM

## 2020-05-29 DIAGNOSIS — O30042 Twin pregnancy, dichorionic/diamniotic, second trimester: Secondary | ICD-10-CM

## 2020-05-29 DIAGNOSIS — Z3482 Encounter for supervision of other normal pregnancy, second trimester: Secondary | ICD-10-CM

## 2020-05-29 LAB — POCT URINALYSIS DIPSTICK OB
Bilirubin, UA: NEGATIVE
Blood, UA: NEGATIVE
Glucose, UA: NEGATIVE
Ketones, UA: NEGATIVE
Leukocytes, UA: NEGATIVE
Nitrite, UA: NEGATIVE
POC,PROTEIN,UA: NEGATIVE
Spec Grav, UA: 1.01 (ref 1.010–1.025)
Urobilinogen, UA: 0.2 E.U./dL
pH, UA: 7 (ref 5.0–8.0)

## 2020-06-01 ENCOUNTER — Encounter: Payer: Medicaid Other | Admitting: Obstetrics and Gynecology

## 2020-06-01 ENCOUNTER — Encounter: Payer: Self-pay | Admitting: Obstetrics and Gynecology

## 2020-06-01 ENCOUNTER — Ambulatory Visit (INDEPENDENT_AMBULATORY_CARE_PROVIDER_SITE_OTHER): Payer: Self-pay | Admitting: Obstetrics and Gynecology

## 2020-06-01 ENCOUNTER — Other Ambulatory Visit: Payer: Self-pay

## 2020-06-01 VITALS — BP 103/72 | HR 86 | Wt 213.3 lb

## 2020-06-01 DIAGNOSIS — Z3A23 23 weeks gestation of pregnancy: Secondary | ICD-10-CM

## 2020-06-01 DIAGNOSIS — Z3482 Encounter for supervision of other normal pregnancy, second trimester: Secondary | ICD-10-CM

## 2020-06-01 DIAGNOSIS — O30042 Twin pregnancy, dichorionic/diamniotic, second trimester: Secondary | ICD-10-CM

## 2020-06-01 DIAGNOSIS — R109 Unspecified abdominal pain: Secondary | ICD-10-CM

## 2020-06-01 DIAGNOSIS — O26892 Other specified pregnancy related conditions, second trimester: Secondary | ICD-10-CM

## 2020-06-01 LAB — POCT URINALYSIS DIPSTICK OB
Bilirubin, UA: NEGATIVE
Blood, UA: NEGATIVE
Glucose, UA: NEGATIVE
Ketones, UA: NEGATIVE
Nitrite, UA: NEGATIVE
POC,PROTEIN,UA: NEGATIVE
Spec Grav, UA: <=1.005 — AB (ref 1.010–1.025)
Urobilinogen, UA: 0.2 E.U./dL
pH, UA: 6 (ref 5.0–8.0)

## 2020-06-01 NOTE — Progress Notes (Signed)
Problem OB: Patient notes sharp pain in lower abdomen, has been going on for a few days. Pain lasts from several minutes to a few hours, but resolves. Denies vaginal bleeding, cramping/contractions, nausea/vomiting.  Possibly secondary to growing uterus with twin pregnancy. Given reassurance. Can take Tylenol, use warm compresses/baths for pain. Discussed flu vaccine concerns. Will hold off on flu vaccine until next visit.  RTC in 4 weeks. For 28 week labs at that visit.

## 2020-06-01 NOTE — Progress Notes (Signed)
ROB-Pt present for routine prenatal care. Pt stated having lower abd and back pain. Pt unsure about flu vaccine today.

## 2020-06-02 NOTE — Patient Instructions (Signed)
Abdominal Pain During Pregnancy  Belly (abdominal) pain is common during pregnancy. There are many possible causes. Most of the time, it is not a serious problem. Other times, it can be a sign that something is wrong with the pregnancy. Always tell your doctor if you have belly pain. Follow these instructions at home:  Do not have sex or put anything in your vagina until your pain goes away completely.  Get plenty of rest until your pain gets better.  Drink enough fluid to keep your pee (urine) pale yellow.  Take over-the-counter and prescription medicines only as told by your doctor.  Keep all follow-up visits as told by your doctor. This is important. Contact a doctor if:  Your pain continues or gets worse after resting.  You have lower belly pain that: ? Comes and goes at regular times. ? Spreads to your back. ? Feels like menstrual cramps.  You have pain or burning when you pee (urinate). Get help right away if:  You have a fever or chills.  You have vaginal bleeding.  You are leaking fluid from your vagina.  You are passing tissue from your vagina.  You throw up (vomit) for more than 24 hours.  You have watery poop (diarrhea) for more than 24 hours.  Your baby is moving less than usual.  You feel very weak or faint.  You have shortness of breath.  You have very bad pain in your upper belly. Summary  Belly (abdominal) pain is common during pregnancy. There are many possible causes.  If you have belly pain during pregnancy, tell your doctor right away.  Keep all follow-up visits as told by your doctor. This is important. This information is not intended to replace advice given to you by your health care provider. Make sure you discuss any questions you have with your health care provider. Document Revised: 11/30/2018 Document Reviewed: 11/14/2016 Elsevier Patient Education  2020 [employer]  

## 2020-06-06 ENCOUNTER — Encounter: Payer: Medicaid Other | Admitting: Obstetrics and Gynecology

## 2020-06-29 ENCOUNTER — Encounter: Payer: Self-pay | Admitting: Obstetrics and Gynecology

## 2020-06-29 ENCOUNTER — Ambulatory Visit (INDEPENDENT_AMBULATORY_CARE_PROVIDER_SITE_OTHER): Payer: Self-pay

## 2020-06-29 ENCOUNTER — Ambulatory Visit (INDEPENDENT_AMBULATORY_CARE_PROVIDER_SITE_OTHER): Payer: Self-pay | Admitting: Obstetrics and Gynecology

## 2020-06-29 ENCOUNTER — Other Ambulatory Visit: Payer: Self-pay

## 2020-06-29 ENCOUNTER — Other Ambulatory Visit: Payer: Medicaid Other

## 2020-06-29 VITALS — BP 116/81 | HR 85 | Wt 218.4 lb

## 2020-06-29 DIAGNOSIS — O30042 Twin pregnancy, dichorionic/diamniotic, second trimester: Secondary | ICD-10-CM

## 2020-06-29 DIAGNOSIS — Z3482 Encounter for supervision of other normal pregnancy, second trimester: Secondary | ICD-10-CM

## 2020-06-29 DIAGNOSIS — Z23 Encounter for immunization: Secondary | ICD-10-CM

## 2020-06-29 DIAGNOSIS — Z3A27 27 weeks gestation of pregnancy: Secondary | ICD-10-CM

## 2020-06-29 NOTE — Addendum Note (Signed)
Addended by: Dorian Pod on: 06/29/2020 09:37 AM   Modules accepted: Orders

## 2020-06-29 NOTE — Progress Notes (Signed)
ROB:  No problems.  GCT today.  Ultrasound today shows good growth.  Breech breech.  Discussed vaginal birth.  Follow-up prenatal visit in 2 weeks followed by ultrasound and visit in 4 weeks

## 2020-06-30 ENCOUNTER — Other Ambulatory Visit: Payer: Medicaid Other

## 2020-07-01 LAB — GLUCOSE, 1 HOUR GESTATIONAL: Gestational Diabetes Screen: 97 mg/dL (ref 65–139)

## 2020-07-01 LAB — CBC
Hematocrit: 33 % — ABNORMAL LOW (ref 34.0–46.6)
Hemoglobin: 10.7 g/dL — ABNORMAL LOW (ref 11.1–15.9)
MCH: 26.2 pg — ABNORMAL LOW (ref 26.6–33.0)
MCHC: 32.4 g/dL (ref 31.5–35.7)
MCV: 81 fL (ref 79–97)
Platelets: 192 10*3/uL (ref 150–450)
RBC: 4.08 x10E6/uL (ref 3.77–5.28)
RDW: 13.3 % (ref 11.7–15.4)
WBC: 8.3 10*3/uL (ref 3.4–10.8)

## 2020-07-01 LAB — RPR: RPR Ser Ql: NONREACTIVE

## 2020-07-04 ENCOUNTER — Other Ambulatory Visit: Payer: Medicaid Other

## 2020-07-04 ENCOUNTER — Encounter: Payer: Medicaid Other | Admitting: Obstetrics and Gynecology

## 2020-07-07 ENCOUNTER — Telehealth: Payer: Self-pay

## 2020-07-07 NOTE — Telephone Encounter (Signed)
Pt called to inform her that her ob visit are not being covered with [place] [employer]. I told the pt I can give her the number to call to change her plan to one that we accept. The pt was given the 3 plans we accept and the number to call [phone].

## 2020-07-14 ENCOUNTER — Other Ambulatory Visit: Payer: Self-pay

## 2020-07-14 ENCOUNTER — Ambulatory Visit (INDEPENDENT_AMBULATORY_CARE_PROVIDER_SITE_OTHER): Payer: 59 | Admitting: Obstetrics and Gynecology

## 2020-07-14 ENCOUNTER — Encounter: Payer: Self-pay | Admitting: Obstetrics and Gynecology

## 2020-07-14 VITALS — BP 103/65 | HR 82 | Wt 223.0 lb

## 2020-07-14 DIAGNOSIS — O30042 Twin pregnancy, dichorionic/diamniotic, second trimester: Secondary | ICD-10-CM

## 2020-07-14 DIAGNOSIS — Z3A29 29 weeks gestation of pregnancy: Secondary | ICD-10-CM

## 2020-07-14 DIAGNOSIS — Z3483 Encounter for supervision of other normal pregnancy, third trimester: Secondary | ICD-10-CM

## 2020-07-14 LAB — POCT URINALYSIS DIPSTICK OB
Bilirubin, UA: NEGATIVE
Blood, UA: NEGATIVE
Glucose, UA: NEGATIVE
Ketones, UA: NEGATIVE
Nitrite, UA: NEGATIVE
POC,PROTEIN,UA: NEGATIVE
Spec Grav, UA: <=1.005 — AB (ref 1.010–1.025)
Urobilinogen, UA: 0.2 E.U./dL
pH, UA: 6.5 (ref 5.0–8.0)

## 2020-07-14 NOTE — Patient Instructions (Signed)
 Third Trimester of Pregnancy The third trimester is from week 28 through week 40 (months 7 through 9). The third trimester is a time when the unborn baby (fetus) is growing rapidly. At the end of the ninth month, the fetus is about 20 inches in length and weighs 6-10 pounds. Body changes during your third trimester Your body will continue to go through many changes during pregnancy. The changes vary from woman to woman. During the third trimester:  Your weight will continue to increase. You can expect to gain 25-35 pounds (11-16 kg) by the end of the pregnancy.  You may begin to get stretch marks on your hips, abdomen, and breasts.  You may urinate more often because the fetus is moving lower into your pelvis and pressing on your bladder.  You may develop or continue to have heartburn. This is caused by increased hormones that slow down muscles in the digestive tract.  You may develop or continue to have constipation because increased hormones slow digestion and cause the muscles that push waste through your intestines to relax.  You may develop hemorrhoids. These are swollen veins (varicose veins) in the rectum that can itch or be painful.  You may develop swollen, bulging veins (varicose veins) in your legs.  You may have increased body aches in the pelvis, back, or thighs. This is due to weight gain and increased hormones that are relaxing your joints.  You may have changes in your hair. These can include thickening of your hair, rapid growth, and changes in texture. Some women also have hair loss during or after pregnancy, or hair that feels dry or thin. Your hair will most likely return to normal after your baby is born.  Your breasts will continue to grow and they will continue to become tender. A yellow fluid (colostrum) may leak from your breasts. This is the first milk you are producing for your baby.  Your belly button may stick out.  You may notice more swelling in your  hands, face, or ankles.  You may have increased tingling or numbness in your hands, arms, and legs. The skin on your belly may also feel numb.  You may feel short of breath because of your expanding uterus.  You may have more problems sleeping. This can be caused by the size of your belly, increased need to urinate, and an increase in your body's metabolism.  You may notice the fetus "dropping," or moving lower in your abdomen (lightening).  You may have increased vaginal discharge.  You may notice your joints feel loose and you may have pain around your pelvic bone. What to expect at prenatal visits You will have prenatal exams every 2 weeks until week 36. Then you will have weekly prenatal exams. During a routine prenatal visit:  You will be weighed to make sure you and the baby are growing normally.  Your blood pressure will be taken.  Your abdomen will be measured to track your baby's growth.  The fetal heartbeat will be listened to.  Any test results from the previous visit will be discussed.  You may have a cervical check near your due date to see if your cervix has softened or thinned (effaced).  You will be tested for Group B streptococcus. This happens between 35 and 37 weeks. Your health care provider may ask you:  What your birth plan is.  How you are feeling.  If you are feeling the baby move.  If you have had any   abnormal symptoms, such as leaking fluid, bleeding, severe headaches, or abdominal cramping.  If you are using any tobacco products, including cigarettes, chewing tobacco, and electronic cigarettes.  If you have any questions. Other tests or screenings that may be performed during your third trimester include:  Blood tests that check for low iron levels (anemia).  Fetal testing to check the health, activity level, and growth of the fetus. Testing is done if you have certain medical conditions or if there are problems during the  pregnancy.  Nonstress test (NST). This test checks the health of your baby to make sure there are no signs of problems, such as the baby not getting enough oxygen. During this test, a belt is placed around your belly. The baby is made to move, and its heart rate is monitored during movement. What is false labor? False labor is a condition in which you feel small, irregular tightenings of the muscles in the womb (contractions) that usually go away with rest, changing position, or drinking water. These are called Braxton Hicks contractions. Contractions may last for hours, days, or even weeks before true labor sets in. If contractions come at regular intervals, become more frequent, increase in intensity, or become painful, you should see your health care provider. What are the signs of labor?  Abdominal cramps.  Regular contractions that start at 10 minutes apart and become stronger and more frequent with time.  Contractions that start on the top of the uterus and spread down to the lower abdomen and back.  Increased pelvic pressure and dull back pain.  A watery or bloody mucus discharge that comes from the vagina.  Leaking of amniotic fluid. This is also known as your "water breaking." It could be a slow trickle or a gush. Let your health care provider know if it has a color or strange odor. If you have any of these signs, call your health care provider right away, even if it is before your due date. Follow these instructions at home: Medicines  Follow your health care provider's instructions regarding medicine use. Specific medicines may be either safe or unsafe to take during pregnancy.  Take a prenatal vitamin that contains at least 600 micrograms (mcg) of folic acid.  If you develop constipation, try taking a stool softener if your health care provider approves. Eating and drinking   Eat a balanced diet that includes fresh fruits and vegetables, whole grains, good sources of protein  such as meat, eggs, or tofu, and low-fat dairy. Your health care provider will help you determine the amount of weight gain that is right for you.  Avoid raw meat and uncooked cheese. These carry germs that can cause birth defects in the baby.  If you have low calcium intake from food, talk to your health care provider about whether you should take a daily calcium supplement.  Eat four or five small meals rather than three large meals a day.  Limit foods that are high in fat and processed sugars, such as fried and sweet foods.  To prevent constipation: ? Drink enough fluid to keep your urine clear or pale yellow. ? Eat foods that are high in fiber, such as fresh fruits and vegetables, whole grains, and beans. Activity  Exercise only as directed by your health care provider. Most women can continue their usual exercise routine during pregnancy. Try to exercise for 30 minutes at least 5 days a week. Stop exercising if you experience uterine contractions.  Avoid heavy lifting.    Do not exercise in extreme heat or humidity, or at high altitudes.  Wear low-heel, comfortable shoes.  Practice good posture.  You may continue to have sex unless your health care provider tells you otherwise. Relieving pain and discomfort  Take frequent breaks and rest with your legs elevated if you have leg cramps or low back pain.  Take warm sitz baths to soothe any pain or discomfort caused by hemorrhoids. Use hemorrhoid cream if your health care provider approves.  Wear a good support bra to prevent discomfort from breast tenderness.  If you develop varicose veins: ? Wear support pantyhose or compression stockings as told by your healthcare provider. ? Elevate your feet for 15 minutes, 3-4 times a day. Prenatal care  Write down your questions. Take them to your prenatal visits.  Keep all your prenatal visits as told by your health care provider. This is important. Safety  Wear your seat belt at  all times when driving.  Make a list of emergency phone numbers, including numbers for family, friends, the hospital, and police and fire departments. General instructions  Avoid cat litter boxes and soil used by cats. These carry germs that can cause birth defects in the baby. If you have a cat, ask someone to clean the litter box for you.  Do not travel far distances unless it is absolutely necessary and only with the approval of your health care provider.  Do not use hot tubs, steam rooms, or saunas.  Do not drink alcohol.  Do not use any products that contain nicotine or tobacco, such as cigarettes and e-cigarettes. If you need help quitting, ask your health care provider.  Do not use any medicinal herbs or unprescribed drugs. These chemicals affect the formation and growth of the baby.  Do not douche or use tampons or scented sanitary pads.  Do not cross your legs for long periods of time.  To prepare for the arrival of your baby: ? Take prenatal classes to understand, practice, and ask questions about labor and delivery. ? Make a trial run to the hospital. ? Visit the hospital and tour the maternity area. ? Arrange for maternity or paternity leave through employers. ? Arrange for family and friends to take care of pets while you are in the hospital. ? Purchase a rear-facing car seat and make sure you know how to install it in your car. ? Pack your hospital bag. ? Prepare the baby's nursery. Make sure to remove all pillows and stuffed animals from the baby's crib to prevent suffocation.  Visit your dentist if you have not gone during your pregnancy. Use a soft toothbrush to brush your teeth and be gentle when you floss. Contact a health care provider if:  You are unsure if you are in labor or if your water has broken.  You become dizzy.  You have mild pelvic cramps, pelvic pressure, or nagging pain in your abdominal area.  You have lower back pain.  You have persistent  nausea, vomiting, or diarrhea.  You have an unusual or bad smelling vaginal discharge.  You have pain when you urinate. Get help right away if:  Your water breaks before 37 weeks.  You have regular contractions less than 5 minutes apart before 37 weeks.  You have a fever.  You are leaking fluid from your vagina.  You have spotting or bleeding from your vagina.  You have severe abdominal pain or cramping.  You have rapid weight loss or weight gain.  You   have shortness of breath with chest pain.  You notice sudden or extreme swelling of your face, hands, ankles, feet, or legs.  Your baby makes fewer than 10 movements in 2 hours.  You have severe headaches that do not go away when you take medicine.  You have vision changes. Summary  The third trimester is from week 28 through week 40, months 7 through 9. The third trimester is a time when the unborn baby (fetus) is growing rapidly.  During the third trimester, your discomfort may increase as you and your baby continue to gain weight. You may have abdominal, leg, and back pain, sleeping problems, and an increased need to urinate.  During the third trimester your breasts will keep growing and they will continue to become tender. A yellow fluid (colostrum) may leak from your breasts. This is the first milk you are producing for your baby.  False labor is a condition in which you feel small, irregular tightenings of the muscles in the womb (contractions) that eventually go away. These are called Braxton Hicks contractions. Contractions may last for hours, days, or even weeks before true labor sets in.  Signs of labor can include: abdominal cramps; regular contractions that start at 10 minutes apart and become stronger and more frequent with time; watery or bloody mucus discharge that comes from the vagina; increased pelvic pressure and dull back pain; and leaking of amniotic fluid. This information is not intended to replace advice  given to you by your health care provider. Make sure you discuss any questions you have with your health care provider. Document Revised: 12/03/2018 Document Reviewed: 09/17/2016 [employer] Patient Education  2020 [employer]   Breastfeeding  Choosing to breastfeed is one of the best decisions you can make for yourself and your baby. A change in hormones during pregnancy causes your breasts to make breast milk in your milk-producing glands. Hormones prevent breast milk from being released before your baby is born. They also prompt milk flow after birth. Once breastfeeding has begun, thoughts of your baby, as well as his or her sucking or crying, can stimulate the release of milk from your milk-producing glands. Benefits of breastfeeding Research shows that breastfeeding offers many health benefits for infants and mothers. It also offers a cost-free and convenient way to feed your baby. For your baby  Your first milk (colostrum) helps your baby's digestive system to function better.  Special cells in your milk (antibodies) help your baby to fight off infections.  Breastfed babies are less likely to develop asthma, allergies, obesity, or type 2 diabetes. They are also at lower risk for sudden infant death syndrome (SIDS).  Nutrients in breast milk are better able to meet your baby's needs compared to infant formula.  Breast milk improves your baby's brain development. For you  Breastfeeding helps to create a very special bond between you and your baby.  Breastfeeding is convenient. Breast milk costs nothing and is always available at the correct temperature.  Breastfeeding helps to burn calories. It helps you to lose the weight that you gained during pregnancy.  Breastfeeding makes your uterus return faster to its size before pregnancy. It also slows bleeding (lochia) after you give birth.  Breastfeeding helps to lower your risk of developing type 2 diabetes, osteoporosis, rheumatoid  arthritis, cardiovascular disease, and breast, ovarian, uterine, and endometrial cancer later in life. Breastfeeding basics Starting breastfeeding  Find a comfortable place to sit or lie down, with your neck and back well-supported.  Place   a pillow or a rolled-up blanket under your baby to bring him or her to the level of your breast (if you are seated). Nursing pillows are specially designed to help support your arms and your baby while you breastfeed.  Make sure that your baby's tummy (abdomen) is facing your abdomen.  Gently massage your breast. With your fingertips, massage from the outer edges of your breast inward toward the nipple. This encourages milk flow. If your milk flows slowly, you may need to continue this action during the feeding.  Support your breast with 4 fingers underneath and your thumb above your nipple (make the letter "C" with your hand). Make sure your fingers are well away from your nipple and your baby's mouth.  Stroke your baby's lips gently with your finger or nipple.  When your baby's mouth is open wide enough, quickly bring your baby to your breast, placing your entire nipple and as much of the areola as possible into your baby's mouth. The areola is the colored area around your nipple. ? More areola should be visible above your baby's upper lip than below the lower lip. ? Your baby's lips should be opened and extended outward (flanged) to ensure an adequate, comfortable latch. ? Your baby's tongue should be between his or her lower gum and your breast.  Make sure that your baby's mouth is correctly positioned around your nipple (latched). Your baby's lips should create a seal on your breast and be turned out (everted).  It is common for your baby to suck about 2-3 minutes in order to start the flow of breast milk. Latching Teaching your baby how to latch onto your breast properly is very important. An improper latch can cause nipple pain, decreased milk  supply, and poor weight gain in your baby. Also, if your baby is not latched onto your nipple properly, he or she may swallow some air during feeding. This can make your baby fussy. Burping your baby when you switch breasts during the feeding can help to get rid of the air. However, teaching your baby to latch on properly is still the best way to prevent fussiness from swallowing air while breastfeeding. Signs that your baby has successfully latched onto your nipple  Silent tugging or silent sucking, without causing you pain. Infant's lips should be extended outward (flanged).  Swallowing heard between every 3-4 sucks once your milk has started to flow (after your let-down milk reflex occurs).  Muscle movement above and in front of his or her ears while sucking. Signs that your baby has not successfully latched onto your nipple  Sucking sounds or smacking sounds from your baby while breastfeeding.  Nipple pain. If you think your baby has not latched on correctly, slip your finger into the corner of your baby's mouth to break the suction and place it between your baby's gums. Attempt to start breastfeeding again. Signs of successful breastfeeding Signs from your baby  Your baby will gradually decrease the number of sucks or will completely stop sucking.  Your baby will fall asleep.  Your baby's body will relax.  Your baby will retain a small amount of milk in his or her mouth.  Your baby will let go of your breast by himself or herself. Signs from you  Breasts that have increased in firmness, weight, and size 1-3 hours after feeding.  Breasts that are softer immediately after breastfeeding.  Increased milk volume, as well as a change in milk consistency and color by the fifth   day of breastfeeding.  Nipples that are not sore, cracked, or bleeding. Signs that your baby is getting enough milk  Wetting at least 1-2 diapers during the first 24 hours after birth.  Wetting at least 5-6  diapers every 24 hours for the first week after birth. The urine should be clear or pale yellow by the age of 5 days.  Wetting 6-8 diapers every 24 hours as your baby continues to grow and develop.  At least 3 stools in a 24-hour period by the age of 5 days. The stool should be soft and yellow.  At least 3 stools in a 24-hour period by the age of 7 days. The stool should be seedy and yellow.  No loss of weight greater than 10% of birth weight during the first 3 days of life.  Average weight gain of 4-7 oz (113-198 g) per week after the age of 4 days.  Consistent daily weight gain by the age of 5 days, without weight loss after the age of 2 weeks. After a feeding, your baby may spit up a small amount of milk. This is normal. Breastfeeding frequency and duration Frequent feeding will help you make more milk and can prevent sore nipples and extremely full breasts (breast engorgement). Breastfeed when you feel the need to reduce the fullness of your breasts or when your baby shows signs of hunger. This is called "breastfeeding on demand." Signs that your baby is hungry include:  Increased alertness, activity, or restlessness.  Movement of the head from side to side.  Opening of the mouth when the corner of the mouth or cheek is stroked (rooting).  Increased sucking sounds, smacking lips, cooing, sighing, or squeaking.  Hand-to-mouth movements and sucking on fingers or hands.  Fussing or crying. Avoid introducing a pacifier to your baby in the first 4-6 weeks after your baby is born. After this time, you may choose to use a pacifier. Research has shown that pacifier use during the first year of a baby's life decreases the risk of sudden infant death syndrome (SIDS). Allow your baby to feed on each breast as long as he or she wants. When your baby unlatches or falls asleep while feeding from the first breast, offer the second breast. Because newborns are often sleepy in the first few weeks of  life, you may need to awaken your baby to get him or her to feed. Breastfeeding times will vary from baby to baby. However, the following rules can serve as a guide to help you make sure that your baby is properly fed:  Newborns (babies 4 weeks of age or younger) may breastfeed every 1-3 hours.  Newborns should not go without breastfeeding for longer than 3 hours during the day or 5 hours during the night.  You should breastfeed your baby a minimum of 8 times in a 24-hour period. Breast milk pumping     Pumping and storing breast milk allows you to make sure that your baby is exclusively fed your breast milk, even at times when you are unable to breastfeed. This is especially important if you go back to work while you are still breastfeeding, or if you are not able to be present during feedings. Your lactation consultant can help you find a method of pumping that works best for you and give you guidelines about how long it is safe to store breast milk. Caring for your breasts while you breastfeed Nipples can become dry, cracked, and sore while breastfeeding. The following   recommendations can help keep your breasts moisturized and healthy:  Avoid using soap on your nipples.  Wear a supportive bra designed especially for nursing. Avoid wearing underwire-style bras or extremely tight bras (sports bras).  Air-dry your nipples for 3-4 minutes after each feeding.  Use only cotton bra pads to absorb leaked breast milk. Leaking of breast milk between feedings is normal.  Use lanolin on your nipples after breastfeeding. Lanolin helps to maintain your skin's normal moisture barrier. Pure lanolin is not harmful (not toxic) to your baby. You may also hand express a few drops of breast milk and gently massage that milk into your nipples and allow the milk to air-dry. In the first few weeks after giving birth, some women experience breast engorgement. Engorgement can make your breasts feel heavy, warm,  and tender to the touch. Engorgement peaks within 3-5 days after you give birth. The following recommendations can help to ease engorgement:  Completely empty your breasts while breastfeeding or pumping. You may want to start by applying warm, moist heat (in the shower or with warm, water-soaked hand towels) just before feeding or pumping. This increases circulation and helps the milk flow. If your baby does not completely empty your breasts while breastfeeding, pump any extra milk after he or she is finished.  Apply ice packs to your breasts immediately after breastfeeding or pumping, unless this is too uncomfortable for you. To do this: ? Put ice in a plastic bag. ? Place a towel between your skin and the bag. ? Leave the ice on for 20 minutes, 2-3 times a day.  Make sure that your baby is latched on and positioned properly while breastfeeding. If engorgement persists after 48 hours of following these recommendations, contact your health care provider or a lactation consultant. Overall health care recommendations while breastfeeding  Eat 3 healthy meals and 3 snacks every day. Well-nourished mothers who are breastfeeding need an additional 450-500 calories a day. You can meet this requirement by increasing the amount of a balanced diet that you eat.  Drink enough water to keep your urine pale yellow or clear.  Rest often, relax, and continue to take your prenatal vitamins to prevent fatigue, stress, and low vitamin and mineral levels in your body (nutrient deficiencies).  Do not use any products that contain nicotine or tobacco, such as cigarettes and e-cigarettes. Your baby may be harmed by chemicals from cigarettes that pass into breast milk and exposure to secondhand smoke. If you need help quitting, ask your health care provider.  Avoid alcohol.  Do not use illegal drugs or marijuana.  Talk with your health care provider before taking any medicines. These include over-the-counter and  prescription medicines as well as vitamins and herbal supplements. Some medicines that may be harmful to your baby can pass through breast milk.  It is possible to become pregnant while breastfeeding. If birth control is desired, ask your health care provider about options that will be safe while breastfeeding your baby. Where to find more information: [employer]: www.llli.org Contact a health care provider if:  You feel like you want to stop breastfeeding or have become frustrated with breastfeeding.  Your nipples are cracked or bleeding.  Your breasts are red, tender, or warm.  You have: ? Painful breasts or nipples. ? A swollen area on either breast. ? A fever or chills. ? Nausea or vomiting. ? Drainage other than breast milk from your nipples.  Your breasts do not become full before feedings   by the fifth day after you give birth.  You feel sad and depressed.  Your baby is: ? Too sleepy to eat well. ? Having trouble sleeping. ? More than 1 week old and wetting fewer than 6 diapers in a 24-hour period. ? Not gaining weight by 5 days of age.  Your baby has fewer than 3 stools in a 24-hour period.  Your baby's skin or the white parts of his or her eyes become yellow. Get help right away if:  Your baby is overly tired (lethargic) and does not want to wake up and feed.  Your baby develops an unexplained fever. Summary  Breastfeeding offers many health benefits for infant and mothers.  Try to breastfeed your infant when he or she shows early signs of hunger.  Gently tickle or stroke your baby's lips with your finger or nipple to allow the baby to open his or her mouth. Bring the baby to your breast. Make sure that much of the areola is in your baby's mouth. Offer one side and burp the baby before you offer the other side.  Talk with your health care provider or lactation consultant if you have questions or you face problems as you breastfeed. This  information is not intended to replace advice given to you by your health care provider. Make sure you discuss any questions you have with your health care provider. Document Revised: 11/06/2017 Document Reviewed: 09/13/2016 Elsevier Patient Education  2020 [employer]  

## 2020-07-14 NOTE — Progress Notes (Signed)
ROB: Doing well. Does note occasional sharp pains in lower pelvic but not worrisome.  Desires to breastfeed and formula feed. Unsure method of contraception but considering non-hormonal method. Discussed need for fetal surveillance in third trimester for twin pregnancy. Next due in 2 weeks. RTC in 2 weeks.

## 2020-07-14 NOTE — Progress Notes (Signed)
ROB-Pt present for routine prenatal care. Pt stated that she was doing well. Pt has tdap and signed btc last visit.

## 2020-07-26 ENCOUNTER — Telehealth: Payer: Self-pay

## 2020-07-26 NOTE — Telephone Encounter (Signed)
Patient called in stating that she woke up last night to a pain in her upper part of her stomach more so on the right side. Patient denies any vaginal bleeding, no cramping. Patient did say she was having shortness of breath. Patient is requesting to be seen today. Could you please advise?

## 2020-07-27 ENCOUNTER — Encounter: Payer: Self-pay | Admitting: Obstetrics and Gynecology

## 2020-07-27 ENCOUNTER — Ambulatory Visit (INDEPENDENT_AMBULATORY_CARE_PROVIDER_SITE_OTHER): Payer: 59

## 2020-07-27 ENCOUNTER — Other Ambulatory Visit: Payer: Self-pay

## 2020-07-27 ENCOUNTER — Ambulatory Visit (INDEPENDENT_AMBULATORY_CARE_PROVIDER_SITE_OTHER): Payer: 59 | Admitting: Obstetrics and Gynecology

## 2020-07-27 VITALS — BP 110/76 | HR 80 | Wt 222.7 lb

## 2020-07-27 DIAGNOSIS — O30043 Twin pregnancy, dichorionic/diamniotic, third trimester: Secondary | ICD-10-CM

## 2020-07-27 DIAGNOSIS — O30042 Twin pregnancy, dichorionic/diamniotic, second trimester: Secondary | ICD-10-CM | POA: Diagnosis not present

## 2020-07-27 DIAGNOSIS — Z3483 Encounter for supervision of other normal pregnancy, third trimester: Secondary | ICD-10-CM

## 2020-07-27 DIAGNOSIS — Z3A27 27 weeks gestation of pregnancy: Secondary | ICD-10-CM

## 2020-07-27 DIAGNOSIS — Z3482 Encounter for supervision of other normal pregnancy, second trimester: Secondary | ICD-10-CM

## 2020-07-27 DIAGNOSIS — Z3A31 31 weeks gestation of pregnancy: Secondary | ICD-10-CM

## 2020-07-27 LAB — POCT URINALYSIS DIPSTICK OB
Bilirubin, UA: NEGATIVE
Blood, UA: NEGATIVE
Glucose, UA: NEGATIVE
Ketones, UA: NEGATIVE
Leukocytes, UA: NEGATIVE
Nitrite, UA: NEGATIVE
POC,PROTEIN,UA: NEGATIVE
Spec Grav, UA: 1.01 (ref 1.010–1.025)
Urobilinogen, UA: 0.2 E.U./dL
pH, UA: 6.5 (ref 5.0–8.0)

## 2020-07-27 NOTE — Progress Notes (Signed)
ROB: Ultrasound today for growth. Both babies breech. (14% growth discrepancy). Patient reports daily fetal movement. Has no complaints.

## 2020-07-28 NOTE — Telephone Encounter (Signed)
Pt was seen in the office on 07/27/2020

## 2020-08-11 ENCOUNTER — Encounter: Payer: Self-pay | Admitting: Obstetrics and Gynecology

## 2020-08-11 ENCOUNTER — Other Ambulatory Visit: Payer: Self-pay

## 2020-08-11 ENCOUNTER — Ambulatory Visit (INDEPENDENT_AMBULATORY_CARE_PROVIDER_SITE_OTHER): Payer: 59 | Admitting: Obstetrics and Gynecology

## 2020-08-11 VITALS — BP 115/70 | HR 80 | Wt 229.6 lb

## 2020-08-11 DIAGNOSIS — Z3A33 33 weeks gestation of pregnancy: Secondary | ICD-10-CM

## 2020-08-11 DIAGNOSIS — O30043 Twin pregnancy, dichorionic/diamniotic, third trimester: Secondary | ICD-10-CM

## 2020-08-11 DIAGNOSIS — Z3483 Encounter for supervision of other normal pregnancy, third trimester: Secondary | ICD-10-CM

## 2020-08-11 LAB — POCT URINALYSIS DIPSTICK OB
Bilirubin, UA: NEGATIVE
Blood, UA: NEGATIVE
Glucose, UA: NEGATIVE
Ketones, UA: NEGATIVE
Nitrite, UA: NEGATIVE
POC,PROTEIN,UA: NEGATIVE
Spec Grav, UA: 1.015
Urobilinogen, UA: 0.2 U/dL
pH, UA: 6.5

## 2020-08-11 NOTE — Progress Notes (Signed)
ROB: Complains of pelvic pressure and cramping. Also notes swelling in legs. Discussed elevating legs in the evening. Advised on warning signs of labor.  For growth scan (and presentation check) next visit. Can plan for mode of delivery at that time. RTC in 2 weeks, for 36 week labs.

## 2020-08-11 NOTE — Patient Instructions (Signed)
Third Trimester of Pregnancy  The third trimester is from week 28 through week 40 (months 7 through 9). This trimester is when your unborn baby (fetus) is growing very fast. At the end of the ninth month, the unborn baby is about 20 inches in length. It weighs about 6-10 pounds. Follow these instructions at home: Medicines  Take over-the-counter and prescription medicines only as told by your doctor. Some medicines are safe and some medicines are not safe during pregnancy.  Take a prenatal vitamin that contains at least 600 micrograms (mcg) of folic acid.  If you have trouble pooping (constipation), take medicine that will make your stool soft (stool softener) if your doctor approves. Eating and drinking   Eat regular, healthy meals.  Avoid raw meat and uncooked cheese.  If you get low calcium from the food you eat, talk to your doctor about taking a daily calcium supplement.  Eat four or five small meals rather than three large meals a day.  Avoid foods that are high in fat and sugars, such as fried and sweet foods.  To prevent constipation: ? Eat foods that are high in fiber, like fresh fruits and vegetables, whole grains, and beans. ? Drink enough fluids to keep your pee (urine) clear or pale yellow. Activity  Exercise only as told by your doctor. Stop exercising if you start to have cramps.  Avoid heavy lifting, wear low heels, and sit up straight.  Do not exercise if it is too hot, too humid, or if you are in a place of great height (high altitude).  You may continue to have sex unless your doctor tells you not to. Relieving pain and discomfort  Wear a good support bra if your breasts are tender.  Take frequent breaks and rest with your legs raised if you have leg cramps or low back pain.  Take warm water baths (sitz baths) to soothe pain or discomfort caused by hemorrhoids. Use hemorrhoid cream if your doctor approves.  If you develop puffy, bulging veins (varicose  veins) in your legs: ? Wear support hose or compression stockings as told by your doctor. ? Raise (elevate) your feet for 15 minutes, 3-4 times a day. ? Limit salt in your food. Safety  Wear your seat belt when driving.  Make a list of emergency phone numbers, including numbers for family, friends, the hospital, and police and fire departments. Preparing for your baby's arrival To prepare for the arrival of your baby:  Take prenatal classes.  Practice driving to the hospital.  Visit the hospital and tour the maternity area.  Talk to your work about taking leave once the baby comes.  Pack your hospital bag.  Prepare the baby's room.  Go to your doctor visits.  Buy a rear-facing car seat. Learn how to install it in your car. General instructions  Do not use hot tubs, steam rooms, or saunas.  Do not use any products that contain nicotine or tobacco, such as cigarettes and e-cigarettes. If you need help quitting, ask your doctor.  Do not drink alcohol.  Do not douche or use tampons or scented sanitary pads.  Do not cross your legs for long periods of time.  Do not travel for long distances unless you must. Only do so if your doctor says it is okay.  Visit your dentist if you have not gone during your pregnancy. Use a soft toothbrush to brush your teeth. Be gentle when you floss.  Avoid cat litter boxes and soil  used by cats. These carry germs that can cause birth defects in the baby and can cause a loss of your baby (miscarriage) or stillbirth.  Keep all your prenatal visits as told by your doctor. This is important. Contact a doctor if:  You are not sure if you are in labor or if your water has broken.  You are dizzy.  You have mild cramps or pressure in your lower belly.  You have a nagging pain in your belly area.  You continue to feel sick to your stomach, you throw up, or you have watery poop.  You have bad smelling fluid coming from your vagina.  You have  pain when you pee. Get help right away if:  You have a fever.  You are leaking fluid from your vagina.  You are spotting or bleeding from your vagina.  You have severe belly cramps or pain.  You lose or gain weight quickly.  You have trouble catching your breath and have chest pain.  You notice sudden or extreme puffiness (swelling) of your face, hands, ankles, feet, or legs.  You have not felt the baby move in over an hour.  You have severe headaches that do not go away with medicine.  You have trouble seeing.  You are leaking, or you are having a gush of fluid, from your vagina before you are 37 weeks.  You have regular belly spasms (contractions) before you are 37 weeks. Summary  The third trimester is from week 28 through week 40 (months 7 through 9). This time is when your unborn baby is growing very fast.  Follow your doctor's advice about medicine, food, and activity.  Get ready for the arrival of your baby by taking prenatal classes, getting all the baby items ready, preparing the baby's room, and visiting your doctor to be checked.  Get help right away if you are bleeding from your vagina, or you have chest pain and trouble catching your breath, or if you have not felt your baby move in over an hour. This information is not intended to replace advice given to you by your health care provider. Make sure you discuss any questions you have with your health care provider. Document Revised: 12/03/2018 Document Reviewed: 09/17/2016 Elsevier Patient Education  [place]. Common Medications Safe in Pregnancy  Acne:      Constipation:  Benzoyl Peroxide     Colace  Clindamycin      Dulcolax Suppository  Topica Erythromycin     Fibercon  Salicylic Acid      Metamucil         Miralax AVOID:        Senakot   Accutane    Cough:  Retin-A       Cough Drops  Tetracycline      Phenergan w/ Codeine if Rx  Minocycline      Robitussin (Plain &  DM)  Antibiotics:     Crabs/Lice:  Ceclor       RID  Cephalosporins    AVOID:  E-Mycins      Kwell  Keflex  Macrobid/Macrodantin   Diarrhea:  Penicillin      Kao-Pectate  Zithromax      Imodium AD         PUSH FLUIDS AVOID:       Cipro     Fever:  Tetracycline      Tylenol (Regular or Extra  Minocycline       Strength)  Levaquin  Extra Strength-Do not          Exceed 8 tabs/24 hrs Caffeine:        <242m/day (equiv. To 1 cup of coffee or  approx. 3 12 oz sodas)         Gas: Cold/Hayfever:       Gas-X  Benadryl      Mylicon  Claritin       Phazyme  **Claritin-D        Chlor-Trimeton    Headaches:  Dimetapp      ASA-Free Excedrin  Drixoral-Non-Drowsy     Cold Compress  Mucinex (Guaifenasin)     Tylenol (Regular or Extra  Sudafed/Sudafed-12 Hour     Strength)  **Sudafed PE Pseudoephedrine   Tylenol Cold & Sinus     Vicks Vapor Rub  Zyrtec  **AVOID if Problems With Blood Pressure         Heartburn: Avoid lying down for at least 1 hour after meals  Aciphex      Maalox     Rash:  Milk of Magnesia     Benadryl    Mylanta       1% Hydrocortisone Cream  Pepcid  Pepcid Complete   Sleep Aids:  Prevacid      Ambien   Prilosec       Benadryl  Rolaids       Chamomile Tea  Tums (Limit 4/day)     Unisom         Tylenol PM         Warm milk-add vanilla or  Hemorrhoids:       Sugar for taste  Anusol/Anusol H.C.  (RX: Analapram 2.5%)  Sugar Substitutes:  Hydrocortisone OTC     Ok in moderation  Preparation H      Tucks        Vaseline lotion applied to tissue with wiping    Herpes:     Throat:  Acyclovir      Oragel  Famvir  Valtrex     Vaccines:         Flu Shot Leg Cramps:       *Gardasil  Benadryl      Hepatitis A         Hepatitis B Nasal Spray:       Pneumovax  Saline Nasal Spray     Polio Booster         Tetanus Nausea:       Tuberculosis test or PPD  Vitamin B6 25 mg TID   AVOID:    Dramamine      *Gardasil  Emetrol       Live Poliovirus  Ginger  Root 250 mg QID    MMR (measles, mumps &  High Complex Carbs @ Bedtime    rebella)  Sea Bands-Accupressure    Varicella (Chickenpox)  Unisom 1/2 tab TID     *No known complications           If received before Pain:         Known pregnancy;   Darvocet       Resume series after  Lortab        Delivery  Percocet    Yeast:   Tramadol      Femstat  Tylenol 3      Gyne-lotrimin  Ultram       Monistat  Vicodin           MISC:  All Sunscreens           Hair Coloring/highlights          Insect Repellant's          (Including DEET)         Mystic Tans

## 2020-08-11 NOTE — Progress Notes (Signed)
ROB-Pt present for routine prenatal care. Pt c/o light cramping like cycle cramps; lower abd pressure and swollen feet.

## 2020-08-26 NOTE — L&D Delivery Note (Signed)
Delivery Summary for Alexis Clark  Labor Events:   Preterm labor: No data found  Rupture date: No data found  Rupture time: 7:57 AM  Rupture type: Spontaneous  Fluid Color: Clear  Induction: No data found  Augmentation: No data found  Complications: No data found  Cervical ripening: No data found No data found   No data found     Delivery:   Episiotomy: No data found  Lacerations: No data found  Repair suture: No data found  Repair # of packets: No data found  Blood loss (ml): 100   Information for the patient's newborn:  Sarah, Baez [[id number]]    Delivery 09/14/2020 10:18 AM by  Vaginal, Spontaneous Sex:  female Gestational Age: [redacted]w[redacted]d Delivery Clinician:   Living?:         APGARS  One minute Five minutes Ten minutes  Skin color:        Heart rate:        Grimace:        Muscle tone:        Breathing:        Totals: 9  10      Presentation/position:      Resuscitation:   Cord information:    Disposition of cord blood:     Blood gases sent?  Complications:   Placenta: Delivered:       appearance Newborn Measurements: Weight: 6 lb 4.5 oz (2850 g)  Height: 18.5"  Head circumference:    Chest circumference:    Other providers:    Additional  information: Forceps:   Vacuum:   Breech:   Observed anomalies      Information for the patient's newborn:  Zykeriah, Mathia [[id number]]    Delivery 09/14/2020 10:30 AM by  Vaginal, Breech Sex:  female Gestational Age: [redacted]w[redacted]d Delivery Clinician:   Living?:         APGARS  One minute Five minutes Ten minutes  Skin color:        Heart rate:        Grimace:        Muscle tone:        Breathing:        Totals: 7  8      Presentation/position:      Resuscitation:   Cord information:    Disposition of cord blood:     Blood gases sent?  Complications:   Placenta: Delivered:       appearance Newborn Measurements: Weight: 4 lb 8.7 oz (2060 g)  Height: 18.5"  Head circumference:    Chest circumference:     Other providers:    Additional  information: Forceps:   Vacuum:   Breech:   Observed anomalies              [name], [name] [[id number]]  Delivery Note At 10:18 AM a viable and healthy female was delivered via Vaginal, Spontaneous (Presentation: Vertex; LOA position).  APGAR: 9, 10; weight 2850 grams.   Placenta status: Spontaneous, Intact.  Cord: 3 vessels with the following complications: None.  Cord pH: not obtained.  Delayed cord clamping observed. Cord blood collected.   Anesthesia: Epidural Episiotomy: None Lacerations: None Suture Repair: None Est. Blood Loss (mL): 100  Mom to postpartum.  Baby to Couplet care / Skin to Skin.  Hildred Laser 09/14/2020, 11:41 AM     [name], [name] [[id number]]  Delivery Note At 10:30 AM a viable and healthy female was delivered via Vaginal, Breech (  footling).  APGAR: 7, 8; weight  2060 grams. Placenta status: Spontaneous, Intact.  Cord: 3 vessels with the following complications: None.  Cord pH: not obtained. Delayed cord clamping not observed due to initial fetal appearance. Cord blood collected.   Anesthesia: Epidural Episiotomy: None Lacerations: None Suture Repair: None Est. Blood Loss (mL): 100  Mom to postpartum.  Baby to Couplet care / Skin to Skin.  Hildred Laser, MD 09/14/2020, 11:41 AM

## 2020-08-29 ENCOUNTER — Ambulatory Visit (INDEPENDENT_AMBULATORY_CARE_PROVIDER_SITE_OTHER): Payer: 59 | Admitting: Obstetrics and Gynecology

## 2020-08-29 ENCOUNTER — Other Ambulatory Visit: Payer: Self-pay

## 2020-08-29 ENCOUNTER — Encounter: Payer: Self-pay | Admitting: Obstetrics and Gynecology

## 2020-08-29 ENCOUNTER — Ambulatory Visit (INDEPENDENT_AMBULATORY_CARE_PROVIDER_SITE_OTHER): Payer: 59

## 2020-08-29 VITALS — BP 110/75 | HR 80 | Wt 230.8 lb

## 2020-08-29 DIAGNOSIS — Z3A36 36 weeks gestation of pregnancy: Secondary | ICD-10-CM

## 2020-08-29 DIAGNOSIS — O30043 Twin pregnancy, dichorionic/diamniotic, third trimester: Secondary | ICD-10-CM

## 2020-08-29 DIAGNOSIS — Z3483 Encounter for supervision of other normal pregnancy, third trimester: Secondary | ICD-10-CM

## 2020-08-29 LAB — POCT URINALYSIS DIPSTICK OB
Bilirubin, UA: NEGATIVE
Blood, UA: NEGATIVE
Glucose, UA: NEGATIVE
Leukocytes, UA: NEGATIVE
Nitrite, UA: NEGATIVE
Spec Grav, UA: <=1.005 — AB (ref 1.010–1.025)
Urobilinogen, UA: 0.2 E.U./dL
pH, UA: 6 (ref 5.0–8.0)

## 2020-08-29 NOTE — Progress Notes (Signed)
ROB: Patient feeling tired.  Reports daily fetal movement.  Ultrasound today shows growth discrepancy at 23.9%.  I have discussed this in detail.  Recommend follow-up growth in 2 weeks followed by MFM consult if discrepancy greater than 25%.  Both babies are vertex-mom desires natural birth if possible.  GC/CT-GBS performed today.

## 2020-08-29 NOTE — Addendum Note (Signed)
Addended by: Dorian Pod on: 08/29/2020 03:44 PM   Modules accepted: Orders

## 2020-08-31 LAB — STREP GP B NAA: Strep Gp B NAA: NEGATIVE

## 2020-08-31 LAB — GC/CHLAMYDIA PROBE AMP
Chlamydia trachomatis, NAA: NEGATIVE
Neisseria Gonorrhoeae by PCR: NEGATIVE

## 2020-09-06 ENCOUNTER — Other Ambulatory Visit: Payer: Self-pay

## 2020-09-06 ENCOUNTER — Encounter: Payer: Self-pay | Admitting: Obstetrics and Gynecology

## 2020-09-06 ENCOUNTER — Ambulatory Visit (INDEPENDENT_AMBULATORY_CARE_PROVIDER_SITE_OTHER): Payer: 59 | Admitting: Obstetrics and Gynecology

## 2020-09-06 VITALS — BP 119/76 | HR 69 | Wt 229.7 lb

## 2020-09-06 DIAGNOSIS — O26899 Other specified pregnancy related conditions, unspecified trimester: Secondary | ICD-10-CM

## 2020-09-06 DIAGNOSIS — R102 Pelvic and perineal pain: Secondary | ICD-10-CM

## 2020-09-06 DIAGNOSIS — O30043 Twin pregnancy, dichorionic/diamniotic, third trimester: Secondary | ICD-10-CM

## 2020-09-06 DIAGNOSIS — Z3A37 37 weeks gestation of pregnancy: Secondary | ICD-10-CM

## 2020-09-06 DIAGNOSIS — Z3483 Encounter for supervision of other normal pregnancy, third trimester: Secondary | ICD-10-CM

## 2020-09-06 LAB — POCT URINALYSIS DIPSTICK OB
Bilirubin, UA: NEGATIVE
Blood, UA: NEGATIVE
Glucose, UA: NEGATIVE
Ketones, UA: NEGATIVE
Nitrite, UA: NEGATIVE
POC,PROTEIN,UA: NEGATIVE
Spec Grav, UA: <=1.005 — AB (ref 1.010–1.025)
Urobilinogen, UA: 0.2 E.U./dL
pH, UA: 6 (ref 5.0–8.0)

## 2020-09-06 NOTE — Progress Notes (Signed)
ROB-Pt present for routine prenatal care. Pt c/o mucous white discharge, lower abd pressure/pain and braxton hick contractions.

## 2020-09-06 NOTE — Progress Notes (Signed)
ROB: Patient denies major complaints.  Feels pressure. Noting mucuoid discharge and low back pain with Alexis Clark.  She reports that her growth ultrasound with MFM was initially scheduled for next week (1/20), but due to unavailability of ultrasonographer, has now been pushed out to the following week.  Discussed that on review of recommendations from ACOG and and [employer], it is recommended that uncomplicated di/di twin pregnancies undergo delivery in 38th week. Also in light of being close to limits of growth discordance, would recommend induction. Patient hesitant regarding induction, noted that she desired to go into labor naturally. Notes that she will discuss with her family.  Given available dates for potential induction based on availability of L&D (next Wednesday, Saturday, or Sunday). Reviewed process of induction. If patient decides to decline induction, will need to f/u next week for ROB visit, and begin antenatal testing, as risk of stillbirth increases after 38th week. Also given list of herbal cervical ripening methods. RTC in 1 week.

## 2020-09-13 ENCOUNTER — Other Ambulatory Visit: Payer: Self-pay | Admitting: Obstetrics and Gynecology

## 2020-09-13 ENCOUNTER — Other Ambulatory Visit: Payer: Self-pay

## 2020-09-13 ENCOUNTER — Ambulatory Visit (INDEPENDENT_AMBULATORY_CARE_PROVIDER_SITE_OTHER): Payer: 59 | Admitting: Obstetrics and Gynecology

## 2020-09-13 ENCOUNTER — Encounter: Payer: Self-pay | Admitting: Obstetrics and Gynecology

## 2020-09-13 VITALS — BP 118/81 | HR 91 | Wt 229.9 lb

## 2020-09-13 DIAGNOSIS — Z3A38 38 weeks gestation of pregnancy: Secondary | ICD-10-CM

## 2020-09-13 DIAGNOSIS — O30043 Twin pregnancy, dichorionic/diamniotic, third trimester: Secondary | ICD-10-CM

## 2020-09-13 LAB — POCT URINALYSIS DIPSTICK OB
Bilirubin, UA: NEGATIVE
Blood, UA: NEGATIVE
Glucose, UA: NEGATIVE
Ketones, UA: NEGATIVE
Leukocytes, UA: NEGATIVE
Nitrite, UA: NEGATIVE
POC,PROTEIN,UA: NEGATIVE
Spec Grav, UA: 1.01 (ref 1.010–1.025)
Urobilinogen, UA: 0.2 E.U./dL
pH, UA: 6 (ref 5.0–8.0)

## 2020-09-13 NOTE — Addendum Note (Signed)
Addended by: [name] on: 09/13/2020 06:22 PM   Modules accepted: Orders, SmartSet

## 2020-09-13 NOTE — Progress Notes (Signed)
ROB: Patient having irregular contractions.  All questions answered regarding induction versus fetal monitoring and management next week.  Patient has decided upon induction for tomorrow.  Confirmed with labor and delivery.  Midnight induction.

## 2020-09-14 ENCOUNTER — Other Ambulatory Visit: Payer: Self-pay

## 2020-09-14 ENCOUNTER — Encounter: Payer: Self-pay | Admitting: Obstetrics and Gynecology

## 2020-09-14 ENCOUNTER — Inpatient Hospital Stay
Admission: EM | Admit: 2020-09-14 | Discharge: 2020-09-16 | DRG: 807 | Disposition: A | Discharge status: Home / Self Care | Payer: 59 | Attending: Obstetrics and Gynecology | Admitting: Obstetrics and Gynecology

## 2020-09-14 ENCOUNTER — Other Ambulatory Visit: Payer: 59

## 2020-09-14 ENCOUNTER — Inpatient Hospital Stay: Payer: 59 | Admitting: Anesthesiology

## 2020-09-14 DIAGNOSIS — O9902 Anemia complicating childbirth: Secondary | ICD-10-CM | POA: Diagnosis present

## 2020-09-14 DIAGNOSIS — O328XX2 Maternal care for other malpresentation of fetus, fetus 2: Secondary | ICD-10-CM | POA: Diagnosis present

## 2020-09-14 DIAGNOSIS — Z20822 Contact with and (suspected) exposure to covid-19: Secondary | ICD-10-CM | POA: Diagnosis present

## 2020-09-14 DIAGNOSIS — O30043 Twin pregnancy, dichorionic/diamniotic, third trimester: Secondary | ICD-10-CM

## 2020-09-14 DIAGNOSIS — O365932 Maternal care for other known or suspected poor fetal growth, third trimester, fetus 2: Secondary | ICD-10-CM | POA: Diagnosis present

## 2020-09-14 DIAGNOSIS — Z3A38 38 weeks gestation of pregnancy: Secondary | ICD-10-CM

## 2020-09-14 DIAGNOSIS — O30033 Twin pregnancy, monochorionic/diamniotic, third trimester: Principal | ICD-10-CM | POA: Diagnosis present

## 2020-09-14 DIAGNOSIS — D649 Anemia, unspecified: Secondary | ICD-10-CM | POA: Diagnosis present

## 2020-09-14 DIAGNOSIS — O99013 Anemia complicating pregnancy, third trimester: Secondary | ICD-10-CM

## 2020-09-14 LAB — CBC
HCT: 31.2 % — ABNORMAL LOW (ref 36.0–46.0)
Hemoglobin: 9.5 g/dL — ABNORMAL LOW (ref 12.0–15.0)
MCH: 22.6 pg — ABNORMAL LOW (ref 26.0–34.0)
MCHC: 30.4 g/dL (ref 30.0–36.0)
MCV: 74.3 fL — ABNORMAL LOW (ref 80.0–100.0)
Platelets: 191 10*3/uL (ref 150–400)
RBC: 4.2 MIL/uL (ref 3.87–5.11)
RDW: 15.9 % — ABNORMAL HIGH (ref 11.5–15.5)
WBC: 7 10*3/uL (ref 4.0–10.5)
nRBC: 0 % (ref 0.0–0.2)

## 2020-09-14 LAB — TYPE AND SCREEN
ABO/RH(D): O POS
Antibody Screen: NEGATIVE

## 2020-09-14 LAB — RESP PANEL BY RT-PCR (FLU A&B, COVID) ARPGX2
Influenza A by PCR: NEGATIVE
Influenza B by PCR: NEGATIVE
SARS Coronavirus 2 by RT PCR: NEGATIVE

## 2020-09-14 LAB — RPR: RPR Ser Ql: NONREACTIVE

## 2020-09-14 MED ORDER — LACTATED RINGERS IV SOLN
500.0000 mL | INTRAVENOUS | Status: DC | PRN
  Administered 2020-09-14: 500 mL via INTRAVENOUS

## 2020-09-14 MED ORDER — OXYTOCIN-SODIUM CHLORIDE 30-0.9 UT/500ML-% IV SOLN
2.5000 [IU]/h | INTRAVENOUS | Status: DC
  Administered 2020-09-14 (×2): 2.5 [IU]/h via INTRAVENOUS
  Administered 2020-09-14: 12 [IU]/h via INTRAVENOUS
  Filled 2020-09-14: qty 1000

## 2020-09-14 MED ORDER — SENNOSIDES-DOCUSATE SODIUM 8.6-50 MG PO TABS
2.0000 | ORAL_TABLET | Freq: Every day | ORAL | Status: DC
  Administered 2020-09-15 – 2020-09-16 (×2): 2 via ORAL
  Filled 2020-09-14 (×2): qty 2

## 2020-09-14 MED ORDER — MISOPROSTOL 200 MCG PO TABS
ORAL_TABLET | ORAL | Status: AC
  Filled 2020-09-14: qty 4

## 2020-09-14 MED ORDER — BUTORPHANOL TARTRATE 1 MG/ML IJ SOLN
1.0000 mg | INTRAMUSCULAR | Status: DC | PRN
Start: 2020-09-14 — End: 2020-09-14
  Administered 2020-09-14: 1 mg via INTRAVENOUS
  Filled 2020-09-14: qty 1

## 2020-09-14 MED ORDER — EPHEDRINE 5 MG/ML INJ
10.0000 mg | INTRAVENOUS | Status: DC | PRN
  Filled 2020-09-14: qty 2

## 2020-09-14 MED ORDER — LIDOCAINE HCL (PF) 1 % IJ SOLN
INTRAMUSCULAR | Status: DC | PRN
  Administered 2020-09-14: 3 mL

## 2020-09-14 MED ORDER — LIDOCAINE HCL (PF) 1 % IJ SOLN
INTRAMUSCULAR | Status: AC
  Filled 2020-09-14: qty 30

## 2020-09-14 MED ORDER — SIMETHICONE 80 MG PO CHEW: 80.0000 mg | CHEWABLE_TABLET | ORAL | Status: DC | PRN

## 2020-09-14 MED ORDER — LACTATED RINGERS IV BOLUS
1000.0000 mL | Freq: Once | INTRAVENOUS | Status: AC
  Administered 2020-09-14: 1000 mL via INTRAVENOUS

## 2020-09-14 MED ORDER — BUPIVACAINE HCL (PF) 0.25 % IJ SOLN
INTRAMUSCULAR | Status: DC | PRN
  Administered 2020-09-14 (×2): 5 mL via EPIDURAL

## 2020-09-14 MED ORDER — AMMONIA AROMATIC IN INHA
RESPIRATORY_TRACT | Status: AC
  Filled 2020-09-14: qty 10

## 2020-09-14 MED ORDER — ACETAMINOPHEN 325 MG PO TABS
650.0000 mg | ORAL_TABLET | ORAL | Status: DC | PRN
  Administered 2020-09-14: 650 mg via ORAL
  Filled 2020-09-14: qty 2

## 2020-09-14 MED ORDER — OXYTOCIN BOLUS FROM INFUSION
333.0000 mL | Freq: Once | INTRAVENOUS | Status: AC
  Administered 2020-09-14: 333 mL via INTRAVENOUS

## 2020-09-14 MED ORDER — FENTANYL CITRATE (PF) 100 MCG/2ML IJ SOLN
INTRAMUSCULAR | Status: DC | PRN
  Administered 2020-09-14: 100 ug via EPIDURAL

## 2020-09-14 MED ORDER — FENTANYL 2.5 MCG/ML W/ROPIVACAINE 0.15% IN NS 100 ML EPIDURAL (ARMC)
EPIDURAL | Status: AC
  Filled 2020-09-14: qty 100

## 2020-09-14 MED ORDER — ONDANSETRON HCL 4 MG/2ML IJ SOLN: 4.0000 mg | INTRAMUSCULAR | Status: DC | PRN

## 2020-09-14 MED ORDER — PRENATAL MULTIVITAMIN CH
1.0000 | ORAL_TABLET | Freq: Every day | ORAL | Status: DC
  Administered 2020-09-15 – 2020-09-16 (×2): 1 via ORAL
  Filled 2020-09-14 (×2): qty 1

## 2020-09-14 MED ORDER — MISOPROSTOL 25 MCG QUARTER TABLET
50.0000 ug | ORAL_TABLET | ORAL | Status: DC | PRN
  Administered 2020-09-14: 50 ug via VAGINAL
  Filled 2020-09-14: qty 1
  Filled 2020-09-14: qty 2

## 2020-09-14 MED ORDER — SOD CITRATE-CITRIC ACID 500-334 MG/5ML PO SOLN
30.0000 mL | ORAL | Status: DC | PRN
  Administered 2020-09-14: 30 mL via ORAL
  Filled 2020-09-14: qty 15

## 2020-09-14 MED ORDER — LACTATED RINGERS IV SOLN
125.0000 mL/h | INTRAVENOUS | Status: DC
  Administered 2020-09-14 (×2): 125 mL/h via INTRAVENOUS

## 2020-09-14 MED ORDER — OXYCODONE-ACETAMINOPHEN 5-325 MG PO TABS: 2.0000 | ORAL_TABLET | ORAL | Status: DC | PRN

## 2020-09-14 MED ORDER — IBUPROFEN 600 MG PO TABS
600.0000 mg | ORAL_TABLET | Freq: Four times a day (QID) | ORAL | Status: DC
  Administered 2020-09-14 – 2020-09-15 (×3): 600 mg via ORAL
  Filled 2020-09-14 (×3): qty 1

## 2020-09-14 MED ORDER — LIDOCAINE-EPINEPHRINE (PF) 1.5 %-1:200000 IJ SOLN
INTRAMUSCULAR | Status: DC | PRN
  Administered 2020-09-14: 2 mL via PERINEURAL
  Administered 2020-09-14: 3 mL via PERINEURAL

## 2020-09-14 MED ORDER — ACETAMINOPHEN 325 MG PO TABS: 650.0000 mg | ORAL_TABLET | ORAL | Status: DC | PRN

## 2020-09-14 MED ORDER — FENTANYL 2.5 MCG/ML W/ROPIVACAINE 0.15% IN NS 100 ML EPIDURAL (ARMC)
12.0000 mL/h | EPIDURAL | Status: DC
Start: 2020-09-14 — End: 2020-09-14

## 2020-09-14 MED ORDER — DIBUCAINE (PERIANAL) 1 % EX OINT: 1.0000 "application " | TOPICAL_OINTMENT | CUTANEOUS | Status: DC | PRN

## 2020-09-14 MED ORDER — FENTANYL CITRATE (PF) 100 MCG/2ML IJ SOLN
INTRAMUSCULAR | Status: AC
  Filled 2020-09-14: qty 2

## 2020-09-14 MED ORDER — TERBUTALINE SULFATE 1 MG/ML IJ SOLN
INTRAMUSCULAR | Status: AC
  Filled 2020-09-14: qty 1

## 2020-09-14 MED ORDER — TERBUTALINE SULFATE 1 MG/ML IJ SOLN
0.2500 mg | Freq: Once | INTRAMUSCULAR | Status: AC
  Administered 2020-09-14: 0.25 mg via SUBCUTANEOUS

## 2020-09-14 MED ORDER — DIPHENHYDRAMINE HCL 50 MG/ML IJ SOLN
12.5000 mg | INTRAMUSCULAR | Status: DC | PRN
Start: 2020-09-14 — End: 2020-09-14

## 2020-09-14 MED ORDER — COCONUT OIL OIL
1.0000 "application " | TOPICAL_OIL | Status: DC | PRN
  Filled 2020-09-14: qty 120

## 2020-09-14 MED ORDER — ONDANSETRON HCL 4 MG PO TABS: 4.0000 mg | ORAL_TABLET | ORAL | Status: DC | PRN

## 2020-09-14 MED ORDER — OXYCODONE-ACETAMINOPHEN 5-325 MG PO TABS: 1.0000 | ORAL_TABLET | ORAL | Status: DC | PRN

## 2020-09-14 MED ORDER — PHENYLEPHRINE 40 MCG/ML (10ML) SYRINGE FOR IV PUSH (FOR BLOOD PRESSURE SUPPORT)
80.0000 ug | PREFILLED_SYRINGE | INTRAVENOUS | Status: DC | PRN
  Filled 2020-09-14: qty 10

## 2020-09-14 MED ORDER — ZOLPIDEM TARTRATE 5 MG PO TABS: 5.0000 mg | ORAL_TABLET | Freq: Every evening | ORAL | Status: DC | PRN

## 2020-09-14 MED ORDER — BENZOCAINE-MENTHOL 20-0.5 % EX AERO: 1.0000 "application " | INHALATION_SPRAY | CUTANEOUS | Status: DC | PRN

## 2020-09-14 MED ORDER — DIPHENHYDRAMINE HCL 25 MG PO CAPS
25.0000 mg | ORAL_CAPSULE | Freq: Four times a day (QID) | ORAL | Status: DC | PRN
Start: 2020-09-14 — End: 2020-09-16

## 2020-09-14 MED ORDER — LACTATED RINGERS IV SOLN
500.0000 mL | Freq: Once | INTRAVENOUS | Status: AC
  Administered 2020-09-14: 500 mL via INTRAVENOUS

## 2020-09-14 MED ORDER — WITCH HAZEL-GLYCERIN EX PADS
1.0000 | MEDICATED_PAD | CUTANEOUS | Status: DC | PRN
Start: 2020-09-14 — End: 2020-09-16

## 2020-09-14 MED ORDER — ONDANSETRON HCL 4 MG/2ML IJ SOLN: 4.0000 mg | Freq: Four times a day (QID) | INTRAMUSCULAR | Status: DC | PRN

## 2020-09-14 MED ORDER — LIDOCAINE HCL (PF) 1 % IJ SOLN: 30.0000 mL | INTRAMUSCULAR | Status: DC | PRN

## 2020-09-14 MED ORDER — OXYTOCIN 10 UNIT/ML IJ SOLN
INTRAMUSCULAR | Status: AC
  Filled 2020-09-14: qty 2

## 2020-09-14 NOTE — Anesthesia Procedure Notes (Signed)
Epidural Patient location during procedure: OB Start time: 09/14/2020 9:30 AM  Staffing Performed: resident/CRNA   Preanesthetic Checklist Completed: patient identified, IV checked, site marked, risks and benefits discussed, surgical consent, monitors and equipment checked, pre-op evaluation and timeout performed  Epidural Patient position: sitting Prep: ChloraPrep Patient monitoring: heart rate, continuous pulse ox and blood pressure Approach: midline Injection technique: LOR saline  Needle:  Needle type: Tuohy  Needle gauge: 18 G Needle length: 9 cm Needle insertion depth: 7 cm Catheter at skin depth: 12 cm Test dose: negative and 1.5% lidocaine with Epi 1:200 K  Assessment Events: blood not aspirated, injection not painful and no paresthesia  Additional Notes Negative heme, negative paresthesia, no pain with injection, negatie csf.

## 2020-09-14 NOTE — H&P (Addendum)
Obstetric History and Physical  Alexis Clark is a 25 y.o. G3P1011 with IUP at [redacted]w[redacted]d presenting for scheduled IOL for mono-di twin pregnancy at term. Patient states she has been having  occasional contractions, no vaginal bleeding, intact membranes, with active fetal movement x 2.    Prenatal Course Source of Care: [facility] with onset of care at 10 weeks Pregnancy complications or risks: Patient Active Problem List   Diagnosis Date Noted  . Monochorionic diamniotic twin gestation in third trimester 09/14/2020  . Anemia of pregnancy in third trimester 09/14/2020  . Irregular menstrual cycle 06/29/2018  . Obesity, unspecified 11/03/2017  . Pilonidal cyst without abscess   . Pilonidal cyst with abscess 09/26/2017  . 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 Zone II fracture of sacrum (HCC) 03/14/2015  . Enlargement of thyroid 11/08/2014   She plans to breastfeed She desires undecided for postpartum contraception.   Prenatal labs and studies: ABO, Rh: --/--/O POS (01/20 0120) Antibody: NEG (01/20 0120) Rubella: 8.73 (07/09 0815) RPR: Non Reactive (11/05 0944)  HBsAg: Negative (07/09 0815)  HIV: Non Reactive (07/09 0815)  IAX:KPVVZSMO/-- (01/04 1654) 1 hr Glucola  normal Genetic screening normal Anatomy US normal    Past Medical History:  Diagnosis Date  . GERD (gastroesophageal reflux disease)    OCC-NO MEDS  . History of asthma     Past Surgical History:  Procedure Laterality Date  . BONY PELVIS SURGERY     AGE 14  . ECTOPIC PREGNANCY SURGERY     REMOVED FALLOPIAN TUBE  . HIP FRACTURE SURGERY Left   . PILONIDAL CYST EXCISION N/A 10/07/2017   Procedure: CYST EXCISION PILONIDAL EXTENSIVE;  Surgeon: Henrene Dodge, MD;  Location: [facility] ORS;  Service: General;  Laterality: N/A;  . SALPINGECTOMY Left     OB History  Gravida Para Term Preterm AB Living  3 1 1  0 1 1  SAB IAB Ectopic Multiple Live Births  0 0 1   1    # Outcome Date GA Lbr Len/2nd  Weight Sex Delivery Anes PTL Lv  3 Current           2 Term 02/09/13   3657 g F Vag-Spont  N LIV  1 Ectopic 2014            Social History   Socioeconomic History  . Marital status: Single    Spouse name: Not on file  . Number of children: Not on file  . Years of education: Not on file  . Highest education level: Not on file  Occupational History  . Not on file  Tobacco Use  . Smoking status: Never Smoker  . Smokeless tobacco: Never Used  Vaping Use  . Vaping Use: Never used  Substance and Sexual Activity  . Alcohol use: Not Currently    Alcohol/week: 2.0 standard drinks    Types: 2 Cans of beer per week  . Drug use: No  . Sexual activity: Not Currently    Partners: Male    Birth control/protection: None  Other Topics Concern  . Not on file  Social History Narrative  . Not on file   Social Determinants of Health   Financial Resource Strain: Not on file  Food Insecurity: Not on file  Transportation Needs: Not on file  Physical Activity: Not on file  Stress: Not on file  Social Connections: Not on file    Family History  Problem Relation Age of Onset  . Asthma Mother   .  Heart disease Mother   . Diabetes Maternal Grandfather   . Diabetes Maternal Grandmother   . Breast cancer Paternal Aunt   . Ovarian cancer Neg Hx   . Colon cancer Neg Hx     Medications Prior to Admission  Medication Sig Dispense Refill Last Dose  . Prenatal Vit-Fe Fumarate-FA (PRENATAL MULTIVITAMIN) TABS tablet Take 1 tablet by mouth daily at 12 noon.       No Known Allergies  Review of Systems: Negative except for what is mentioned in HPI.  Physical Exam: BP 125/76 (BP Location: Left Arm)   Pulse 81   Temp 98.2 F (36.8 C) (Oral)   Resp 14   Ht 5\' 5"  (1.651 m)   Wt 103.9 kg   LMP 12/18/2019 (Approximate)   BMI 38.11 kg/m  CONSTITUTIONAL: Well-developed, well-nourished female in no acute distress.  HENT:  Normocephalic, atraumatic, External right and left ear normal.  Oropharynx is clear and moist EYES: Conjunctivae and EOM are normal. Pupils are equal, round, and reactive to light. No scleral icterus.  NECK: Normal range of motion, supple, no masses SKIN: Skin is warm and dry. No rash noted. Not diaphoretic. No erythema. No pallor. NEUROLOGIC: Alert and oriented to person, place, and time. Normal reflexes, muscle tone coordination. No cranial nerve deficit noted. PSYCHIATRIC: Normal mood and affect. Normal behavior. Normal judgment and thought content. CARDIOVASCULAR: Normal heart rate noted, regular rhythm RESPIRATORY: Effort and breath sounds normal, no problems with respiration noted ABDOMEN: Soft, nontender, nondistended, gravid. MUSCULOSKELETAL: Normal range of motion. No edema and no tenderness. 2+ distal pulses.  Cervical Exam: Dilatation 4 cm   Effacement 60%   Station -3. SROM @ 0805   Presentation: cephalic FHT A:  Baseline rate 150 bpm   Variability moderate  Accelerations present   Decelerations none FHT B:  Baseline rate 135 bpm   Variability moderate  Accelerations present   Decelerations intermittent variable Contractions: Every 2-3 mins   Pertinent Labs/Studies:   Results for orders placed or performed during the hospital encounter of 09/14/20 (from the past 24 hour(s))  Resp Panel by RT-PCR (Flu A&B, Covid) Nasopharyngeal Swab     Status: None   Collection Time: 09/14/20  1:05 AM   Specimen: Nasopharyngeal Swab; Nasopharyngeal(NP) swabs in vial transport medium  Result Value Ref Range   SARS Coronavirus 2 by RT PCR NEGATIVE NEGATIVE   Influenza A by PCR NEGATIVE NEGATIVE   Influenza B by PCR NEGATIVE NEGATIVE  CBC     Status: Abnormal   Collection Time: 09/14/20  1:20 AM  Result Value Ref Range   WBC 7.0 4.0 - 10.5 K/uL   RBC 4.20 3.87 - 5.11 MIL/uL   Hemoglobin 9.5 (L) 12.0 - 15.0 g/dL   HCT 09/16/20 (L) 18.8 - 41.6 %   MCV 74.3 (L) 80.0 - 100.0 fL   MCH 22.6 (L) 26.0 - 34.0 pg   MCHC 30.4 30.0 - 36.0 g/dL   RDW 60.6 (H) 30.1 -  15.5 %   Platelets 191 150 - 400 K/uL   nRBC 0.0 0.0 - 0.2 %  Type and screen     Status: None   Collection Time: 09/14/20  1:20 AM  Result Value Ref Range   ABO/RH(D) O POS    Antibody Screen NEG    Sample Expiration      09/17/2020,2359 Performed at [facility], [address], [place], [place]     Imaging:  [id number] [place] Follow Up Patient Name: Taleeya Blondin  Alexis Clark DOB: 1995-11-13 MRN: [id number]  Indications:growth/afi  Findings: Twin Mono/Di intrauterine pregnancy is visualized with   Baby A  FHR at 129 BPM. .  Fetal presentation is vertex.  Placenta: posterior. Grade: 1 GLP: 3.8 cm  Growth percentile is 47 EFW: 2910g ( 6 lbs 7 oz)   Baby B  FHR at 160 BPM Fetal presentation is vertex.  Placenta: posterior. Grade: 1 GLP 4.8cm  Growth percentile is 12. EFW: 2347 g ( 5 lbs 3 oz)   Impression: 1. [redacted]w[redacted]d Viable Twin Di/Di Intrauterine pregnancy previously established  criteria. 2. AGrowth is 47 %ile. GLP is 3.8cm. B Growth is 12 %ile. GLPis 4.8cm.     Recommendations: 1.Clinical correlation with the patient's History and Physical Exam.   Jenine  M. Marciano Sequin     RDMS  The ultrasound images and findings were reviewed by me and I agree with  the above report.  [name], M.D. 08/29/2020 3:34 PM US OB Follow Up AddL Shel[facility] Silvas Patient Name: Alexis Clark DOB: 1996-04-03 MRN: [id number]  Indications:growth/afi  Findings: Twin Mono/Di intrauterine pregnancy is visualized with   Baby A  FHR at 129 BPM. .  Fetal presentation is vertex.  Placenta: posterior. Grade: 1 GLP: 3.8 cm  Growth percentile is 47 EFW: 2910g ( 6 lbs 7 oz)   Baby B  FHR at 160 BPM Fetal presentation is vertex.  Placenta: posterior. Grade: 1 GLP 4.8cm  Growth percentile is 12. EFW: 2347 g ( 5 lbs 3 oz)   Impression: 1. [redacted]w[redacted]d Viable Twin Di/Di Intrauterine pregnancy previously established  criteria. 2. AGrowth is 47 %ile. GLP is  3.8cm. B Growth is 12 %ile. GLPis 4.8cm.     Recommendations: 1.Clinical correlation with the patient's History and Physical Exam.   Jenine  M. Marciano Sequin     RDMS  The ultrasound images and findings were reviewed by me and I agree with  the above report.  [name], M.D. 08/29/2020 3:34 PM   Assessment : Alexis Clark is a 25 y.o. G3P1011 at [redacted]w[redacted]d being admitted for induction of labor due to mono-di twin at term. Borderline significant growth discordance (19%ile).   Plan: Labor: Expectant management at this time.  Received 1 dose of Cytotec at ~ 0200 this morning, however received a dose of terbutaline at 0500 due to tachysystole. Now with SROM. Analgesia as needed. FWB: Overall reassuring fetal heart tracing x 2.  GBS negative Delivery plan: Hopeful for vaginal delivery    Hildred Laser, MD [facility]

## 2020-09-14 NOTE — Lactation Note (Signed)
This note was copied from a baby's chart. Lactation Consultation Note  Patient Name: Alexis Clark [id number] Date: 09/14/2020 Reason for consult: Initial assessment;1st time breastfeeding;Early term 37-38.6wks Age:26 hours  Maternal Data Formula Feeding for Exclusion: No Has patient been taught Hand Expression?: Yes Does the patient have breastfeeding experience prior to this delivery?: Yes  Feeding Feeding Type: Breast Fed Few attempts made to latch deeply, baby rooting, able to latch well with compression and shaping of breast in football hold, came off breast for short periods with position changes but latched back easily with shaping of breast, little stimulation needed to continue nursing, swallows seen, difficult to hear audible swallows with noise in room, nursed 40 min on right breast LATCH Score Latch: Repeated attempts needed to sustain latch, nipple held in mouth throughout feeding, stimulation needed to elicit sucking reflex.  Audible Swallowing: Spontaneous and intermittent  Type of Nipple: Everted at rest and after stimulation  Comfort (Breast/Nipple): Soft / non-tender  Hold (Positioning): Assistance needed to correctly position infant at breast and maintain latch.  LATCH Score: 8  Interventions Interventions: Breast feeding basics reviewed;Assisted with latch;Skin to skin;Breast massage;Hand express;Breast compression;Adjust position;Support pillows;Position options  Lactation Tools Discussed/Used WIC Program: No   Consult Status Consult Status: Follow-up Date: 09/14/20 Follow-up type: In-patient    Dyann Kief 09/14/2020, 12:17 PM

## 2020-09-14 NOTE — Anesthesia Preprocedure Evaluation (Signed)
Anesthesia Evaluation  Patient identified by MRN, date of birth, ID band Patient awake    History of Anesthesia Complications Negative for: history of anesthetic complications  Airway Mallampati: II  TM Distance: >3 FB Neck ROM: Full    Dental  (+) Teeth Intact   Pulmonary asthma ,           Cardiovascular negative cardio ROS   Rhythm:Regular Rate:Normal     Neuro/Psych negative neurological ROS  negative psych ROS   GI/Hepatic GERD  ,  Endo/Other    Renal/GU   negative genitourinary   Musculoskeletal   Abdominal   Peds  Hematology   Anesthesia Other Findings   Reproductive/Obstetrics                             Anesthesia Physical Anesthesia Plan  ASA: II  Anesthesia Plan: Epidural   Post-op Pain Management:    Induction:   PONV Risk Score and Plan:   Airway Management Planned:   Additional Equipment:   Intra-op Plan:   Post-operative Plan:   Informed Consent: I have reviewed the patients History and Physical, chart, labs and discussed the procedure including the risks, benefits and alternatives for the proposed anesthesia with the patient or authorized representative who has indicated his/her understanding and acceptance.       Plan Discussed with: CRNA and Anesthesiologist  Anesthesia Plan Comments:         Anesthesia Quick Evaluation

## 2020-09-14 NOTE — Progress Notes (Signed)
Pt transported to OR for delivery. MD at bedside to attend delivery. RN at bedside assessing HR. Baby A FHT-150 Baby B FHT-120

## 2020-09-14 NOTE — Lactation Note (Signed)
This note was copied from a baby's chart. Lactation Consultation Note  Patient Name: Alexis Clark [id number] Date: 09/14/2020 Reason for consult: Initial assessment;1st time breastfeeding;Early term 37-38.6wks Age:26 hours  Maternal Data Formula Feeding for Exclusion: No Has patient been taught Hand Expression?: Yes Does the patient have breastfeeding experience prior to this delivery?: Yes  Feeding Feeding Type: Breast Fed Baby eager, rooting, latched easily to left breast and is nursing well, not needing stimulation in football hold, came off breast once, cried and repostioned into cradle hold on left breast, eagerly nursing and has nursed 50 min.   LATCH Score Latch: Grasps breast easily, tongue down, lips flanged, rhythmical sucking.  Audible Swallowing: Spontaneous and intermittent  Type of Nipple: Everted at rest and after stimulation  Comfort (Breast/Nipple): Soft / non-tender  Hold (Positioning): Assistance needed to correctly position infant at breast and maintain latch.  LATCH Score: 9  Interventions Interventions: Breast feeding basics reviewed;Assisted with latch;Skin to skin;Breast massage;Adjust position;Support pillows;Position options (left football)  Lactation Tools Discussed/Used WIC Program: No   Consult Status Consult Status: PRN    Dyann Kief 09/14/2020, 12:22 PM

## 2020-09-14 NOTE — Lactation Note (Signed)
This note was copied from a baby's chart. Lactation Consultation Note  Patient Name: Caitlinn Klinker [id number]'H Date: 09/14/2020 Reason for consult: Follow-up assessment Age:26 hours  Maternal Data    Feeding Feeding Type: Breast Fed Latched easily to right breast in cradle hold, audible swallows noted LATCH Score Latch: Grasps breast easily, tongue down, lips flanged, rhythmical sucking.  Audible Swallowing: Spontaneous and intermittent  Type of Nipple: Everted at rest and after stimulation  Comfort (Breast/Nipple): Soft / non-tender  Hold (Positioning): Assistance needed to correctly position infant at breast and maintain latch.  LATCH Score: 9  Interventions Interventions: Assisted with latch  Lactation Tools Discussed/Used  LC name and no written on white board, mom given breastfeeding resources info.   Consult Status Consult Status: PRN    Dyann Kief 09/14/2020, 3:59 PM

## 2020-09-14 NOTE — Lactation Note (Signed)
This note was copied from a baby's chart. Lactation Consultation Note  Patient Name: Alexis Clark ALPFX'T Date: 09/14/2020 Reason for consult: Follow-up assessment Age:26 hours  Maternal Data  Mom pumped breasts for first time after visiting baby B in SCN, only pumped left breast, obtained drops of colostrum, shown how to clean parts and reassemble   Feeding Feeding Type: Breast Fed  LATCH Score Latch: Grasps breast easily, tongue down, lips flanged, rhythmical sucking.  Audible Swallowing: Spontaneous and intermittent  Type of Nipple: Everted at rest and after stimulation  Comfort (Breast/Nipple): Soft / non-tender  Hold (Positioning): Assistance needed to correctly position infant at breast and maintain latch.  LATCH Score: 9  Interventions Interventions: DEBP  Lactation Tools Discussed/Used Pump Education: Setup, frequency, and cleaning;Milk Storage Initiated by:: Cay Schillings RNC IBCLC Date initiated:: 09/14/20 Encouraged to pump both breasts after nursing twin A or at least every 3 hrs until baby B can go to breast again.  Given breast feeding resource sheets  Consult Status Consult Status: Follow-up Date: 09/15/20 Follow-up type: In-patient    Dyann Kief 09/14/2020, 5:29 PM

## 2020-09-15 LAB — CBC
HCT: 30.6 % — ABNORMAL LOW (ref 36.0–46.0)
Hemoglobin: 9.3 g/dL — ABNORMAL LOW (ref 12.0–15.0)
MCH: 22.4 pg — ABNORMAL LOW (ref 26.0–34.0)
MCHC: 30.4 g/dL (ref 30.0–36.0)
MCV: 73.7 fL — ABNORMAL LOW (ref 80.0–100.0)
Platelets: 199 10*3/uL (ref 150–400)
RBC: 4.15 MIL/uL (ref 3.87–5.11)
RDW: 16 % — ABNORMAL HIGH (ref 11.5–15.5)
WBC: 7.5 10*3/uL (ref 4.0–10.5)
nRBC: 0 % (ref 0.0–0.2)

## 2020-09-15 MED ORDER — IBUPROFEN 600 MG PO TABS
600.0000 mg | ORAL_TABLET | Freq: Four times a day (QID) | ORAL | Status: DC
  Administered 2020-09-15 – 2020-09-16 (×2): 600 mg via ORAL
  Filled 2020-09-15 (×4): qty 1

## 2020-09-15 NOTE — Lactation Note (Signed)
This note was copied from a baby's chart. Lactation Consultation Note  Patient Name: Alexis Clark EUMPN'T Date: 09/15/2020 Reason for consult: Follow-up assessment;Infant < 6lbs;Multiple gestation (twin is less than 6lbs) Age:26 hours  Lactation Rounds: LC to the room for a visit. Friend at bedside is changing the baby's diaper. Mother states feeds are going well fir Twin A. She is requesting more assistance with Twin B. LC explained sometimes smaller babies do need more time and assistance, and that is normal. LC reviewed and encouraged feeding on demand and with cues. If baby is not cueing we encourage hand expression. Reviewed diaper counts for days of life and when to call Peds with questions. Reviewed "understanding Postpartum and Newborn care " booklet at bedside. Reviewed outpatient Lactation number and resources.  Reviewed pacifier, pumping, and bottles are not encouraged until breastfeeding is established and going well in the first 4 weeks. Mother has a "perple" pump at home, unsure of brans. Stated she has Media planner. LC gave her web sites to order her pump through insurance. Mother stated understanding with all teaching, and has no further questions at this time. LC # on board.   Maternal Data Formula Feeding for Exclusion: No Has patient been taught Hand Expression?: Yes Does the patient have breastfeeding experience prior to this delivery?: Yes  Feeding Feeding Type: Breast Fed  LATCH Score Latch: Grasps breast easily, tongue down, lips flanged, rhythmical sucking.  Audible Swallowing: Spontaneous and intermittent  Type of Nipple: Everted at rest and after stimulation  Comfort (Breast/Nipple): Soft / non-tender  Hold (Positioning): Assistance needed to correctly position infant at breast and maintain latch.  LATCH Score: 9  Interventions Interventions: Breast feeding basics reviewed;Assisted with latch;Hand express;Adjust position;Position  options  Lactation Tools Discussed/Used WIC Program: No Pump Education: Setup, frequency, and cleaning;Milk Storage  Consult Status Consult Status: Follow-up Date: 09/16/20 Follow-up type: Call as needed  Graiden Henes D Natilie Krabbenhoft 09/15/2020, 11:49 AM

## 2020-09-15 NOTE — Progress Notes (Addendum)
Post Partum Day # 1, s/p SVD (mono-di twins)  Subjective: no complaints, up ad lib, voiding and tolerating PO  Objective: Temp:  [97.8 F (36.6 C)-99.2 F (37.3 C)] 98 F (36.7 C) (01/21 0748) Pulse Rate:  [67-115] 68 (01/21 0748) Resp:  [18-20] 20 (01/21 0748) BP: (122-148)/(63-96) 128/79 (01/21 0748) SpO2:  [95 %-100 %] 97 % (01/21 0748)  Physical Exam:  General: alert and no distress  Lungs: clear to auscultation bilaterally Breasts: normal appearance, no masses or tenderness Heart: regular rate and rhythm, S1, S2 normal, no murmur, click, rub or gallop Abdomen: soft, non-tender; bowel sounds normal; no masses,  no organomegaly Pelvis: Lochia: appropriate, Uterine Fundus: firm Extremities: DVT Evaluation: No evidence of DVT seen on physical exam. Negative Homan's sign. No cords or calf tenderness. No significant calf/ankle edema.  Recent Labs    09/14/20 0120 09/15/20 0709  HGB 9.5* 9.3*  HCT 31.2* 30.6*    Assessment/Plan: Doing well postpartum Breastfeeding, Lactation consult  Contraception undecided. To discuss at postpartum visit.  Anemia of pregnancy, mild. Asymptomatic. Will treat with PO iron supplementation.  Baby B in NICU for blood sugar monitoring due to SGA at term.  Plan for discharge tomorrow   LOS: 1 day   Hildred Laser, MD [facility] 09/15/2020 8:18 AM

## 2020-09-15 NOTE — Anesthesia Postprocedure Evaluation (Signed)
Anesthesia Post Note  Patient: Alexis Clark  Procedure(s) Performed: AN AD HOC LABOR EPIDURAL  Patient location during evaluation: Mother Baby Anesthesia Type: Epidural Level of consciousness: awake and alert and oriented Pain management: pain level controlled Vital Signs Assessment: post-procedure vital signs reviewed and stable Respiratory status: respiratory function stable Cardiovascular status: stable Postop Assessment: no headache, no backache, no apparent nausea or vomiting, able to ambulate and adequate PO intake Anesthetic complications: no   No complications documented.   Last Vitals:  Vitals:   09/15/20 0349 09/15/20 0748  BP: 122/81 128/79  Pulse: 67 68  Resp: 20 20  Temp: 36.6 C 36.7 C  SpO2: 98% 97%    Last Pain:  Vitals:   09/15/20 0800  TempSrc:   PainSc: 0-No pain                 Zachary George

## 2020-09-15 NOTE — Discharge Instructions (Signed)

## 2020-09-16 MED ORDER — IBUPROFEN 600 MG PO TABS: 600.0000 mg | ORAL_TABLET | Freq: Four times a day (QID) | ORAL | 1 refills | Status: DC

## 2020-09-16 NOTE — Discharge Summary (Signed)
Postpartum Discharge Summary     Patient Name: Alexis Clark DOB: 1996-06-13 MRN: [id number]  Date of admission: 09/14/2020 Delivery date:   Biviana, Saddler [[id number]]  09/14/2020    Cristianna, Cyr [[id number]]  09/14/2020   Delivering provider:    Jackelyne, Sayer [[id number]]  Gisselle, Galvis [[id number]]  Rubie Maid   Date of discharge: 09/16/2020  Admitting diagnosis: Monochorionic Diamniotic twin pregnancy in first trimester Intrauterine pregnancy: [redacted]w[redacted]d    Secondary diagnosis:  Active Problems:   Monochorionic diamniotic twin gestation in third trimester   Anemia of pregnancy in third trimester  Additional problems: None    Discharge diagnosis: Term Pregnancy Delivered and Anemia                                        Post partum procedures:None Augmentation: Cytotec Complications: None  Hospital course: Induction of Labor With Vaginal Delivery   25y.o. yo G3P1011 at 376w5das admitted to the hospital 09/14/2020 for induction of labor.  Indication for induction: Multiple gestation at term.  Patient had an uncomplicated labor course as follows: Membrane Rupture Time/Date:    RiShaquia, Berkley0[[id number]]7:57 AM    RiMoreen Fowler0[[id number]]10:28 AM  ,   RiBernisha, Verma0[[id number]]    RiProvidence, Stivers0[[id number]]09/14/2020    Delivery Method:   RiPollyann Savoy0[[id number]]Vaginal, Spontaneous    RiDannelle, Rhymes0[[id number]]Vaginal, Breech   Episiotomy:    RiShirlean, Berman0[[id number]]None    RiMikiyah, Glasner0[[id number]]None   Lacerations:     RiShena, Vinluan0[[id number]]None    RiLucrezia, Dehne0[[id number]]None   Details of delivery can be found in separate delivery note.  Patient had a routine postpartum course. Patient is discharged home 09/16/20.  Newborn Data: Birth date:   RiYamilee, Harmes0[[id number]]09/14/2020    RiMoneka, Mcquinn[[id number]]09/14/2020   Birth time:   RiKhaniya, Tenaglia0[[id number]]10:18 AM    RiLesbia, Ottaway0[[id number]]10:30 AM   Gender:   RiGlendoris, Nodarse0[[id number]]Female    RiBrittony, Billick0[[id number]]Female   Living status:   RiMaryiah, Olvey0[[id number]]Living    RiKearston, Putman0[[id number]]Living   Apgars:   RiAlison, Kubicki0[[id number]]9 [address]0[[id number]]7 [place],   RiJaclene, Bartelt0[[id number]][address]  RiIsma, Tietje0[[id number]]8   Weight:   RiKalissa, Grays0[[id number]]  5974    RiAnisa, Leanos0[[id number]]2060 g    Magnesium Sulfate received: No BMZ received: No Rhophylac:No MMR:No T-DaP:Given prenatally Flu: Yes (given prenatally) Transfusion:No  Physical exam  Vitals:   09/15/20 0748 09/15/20 1541 09/16/20 0308 09/16/20 0743  BP: 128/79 125/66 (!) 124/95 132/78  Pulse: 68 74 63 75  Resp: '20 18 20 18  ' Temp: 98 F (36.7 C) 98.1 F (36.7 C) 98 F (36.7 C) 98.1 F (36.7 C)  TempSrc: Oral Oral Oral Oral  SpO2: 97%  100% 99%  Weight:      Height:       General: alert, cooperative and no distress  Lochia: appropriate Uterine Fundus: firm Incision: N/A DVT Evaluation: No evidence of DVT seen on physical exam. Negative Homan's sign. No cords or calf tenderness. Trace edema.    Labs: Lab Results  Component Value Date   WBC 7.5 09/15/2020   HGB 9.3 (L) 09/15/2020   HCT 30.6 (L) 09/15/2020   MCV 73.7 (L) 09/15/2020   PLT 199 09/15/2020   CMP Latest Ref Rng & Units 02/12/2020  Glucose 70 - 99 mg/dL 108(H)  BUN 6 - 20 mg/dL 7  Creatinine 0.44 - 1.00 mg/dL 0.52  Sodium 135 - 145 mmol/L 135  Potassium 3.5 - 5.1 mmol/L 3.3(L)  Chloride 98 - 111 mmol/L 105  CO2 22 - 32 mmol/L 22  Calcium 8.9 - 10.3 mg/dL 9.2  Total Protein 6.5 - 8.1 g/dL 7.4  Total Bilirubin 0.3 - 1.2 mg/dL 0.5  Alkaline Phos 38 - 126 U/L 48  AST 15 - 41 U/L 18  ALT 0 - 44 U/L 17   Edinburgh  Score: Edinburgh Postnatal Depression Scale Screening Tool 09/15/2020  I have been able to laugh and see the funny side of things. (No Data)      After visit meds:  Allergies as of 09/16/2020   No Known Allergies     Medication List    TAKE these medications   ibuprofen 600 MG tablet Commonly known as: ADVIL Take 1 tablet (600 mg total) by mouth every 6 (six) hours.   prenatal multivitamin Tabs tablet Take 1 tablet by mouth daily at 12 noon.        Discharge home in stable condition Infant Feeding: Breast Infant Disposition:home with mother Discharge instruction: per After Visit Summary and Postpartum booklet. Activity: Advance as tolerated. Pelvic rest for 6 weeks.  Diet: routine diet Anticipated Birth Control: Unsure Postpartum Appointment:6 weeks Additional Postpartum F/U: None Future Appointments: Future Appointments  Date Time Provider [place]  09/20/2020  9:30 AM [facility] [place] [facility] None   Follow up Visit:  Follow-up Information    [facility]. Schedule an appointment as soon as possible for a visit in 6 week(s).   Why: postpartum follow up Contact information: [place]  [place]       Rubie Maid, MD Follow up today.   Specialties: Obstetrics and Gynecology, Radiology Why: 4-5 weeks postpartum visit Contact information: [place] [address] [place] [address] [phone]                   09/16/2020 Rubie Maid, MD

## 2020-09-16 NOTE — Lactation Note (Signed)
This note was copied from a baby's chart. Lactation Consultation Note  Patient Name: Alexis Clark Date: 09/16/2020 Reason for consult: Follow-up assessment;NICU baby;Multiple gestation Age:26 hours  Lactation Rounds: LC to visit Mother and twins in the SCN. Mother states feeds are going well, twin A was cluster feeding last night and she has sore nipples. Pacifier was given. LC reviewed and encouraged feeding on demand and with cues. LC explained cluster feeding is normal with Mother's hormones. Encouraged a deep latch. Mother is heading back to her room and will call when she has baby at breast. Reviewed diaper counts for days of life and when to call Peds with questions. Reviewed "understanding Postpartum and Newborn care " booklet at bedside. Reviewed outpatient Lactation number and resources.  Mother called out, baby was latched and positioned well at breast on the right in cradle / reclined hold. Baby has a rhythmic sucking pattern with many swallows noted.  LC encouraged her to have lips flanged during feed, taught how to push into the breast and pull down on chin to help bottom lip rollout. Mother stated the latch now feels better. [facility] # remains on board. Encouraged to call if needed and to rest and attempt to pump when she is able. Mother stated understanding with all teaching, and is doing a great job.   Maternal Data Formula Feeding for Exclusion: No Has patient been taught Hand Expression?: Yes  Feeding Feeding Type: Breast Fed  LATCH Score Latch: Grasps breast easily, tongue down, lips flanged, rhythmical sucking.  Audible Swallowing: Spontaneous and intermittent  Type of Nipple: Everted at rest and after stimulation  Comfort (Breast/Nipple): Soft / non-tender  Hold (Positioning): No assistance needed to correctly position infant at breast.  LATCH Score: 10  Interventions Interventions: Breast feeding basics reviewed;Assisted with latch (Baby was positioned  well at breast, LC taught how to roll bottom lip out after latched.)  Lactation Tools Discussed/Used     Consult Status Consult Status: Follow-up Follow-up type: Call as needed    Tashala Cumbo D Taveon Enyeart 09/16/2020, 1:01 PM

## 2020-09-16 NOTE — Progress Notes (Signed)
Pt discharged with infant staying overnight for phototherapy. Discharge instructions, prescriptions, and follow up appointments given to and reviewed with patient. Pt verbalized understanding. Pt remains in room with infant.

## 2020-09-16 NOTE — Progress Notes (Signed)
Progress Note - Vaginal Delivery  Alexis Clark is a 25 y.o. G3P1011 now PP day 2 s/p    Alexis Clark [[id number]]  Vaginal, Spontaneous    Alexis Clark [[id number]]  Vaginal, Breech  .   Subjective:  The patient reports no complaints, up ad lib, voiding and tolerating PO   Objective:  Vital signs in last 24 hours: Temp:  [98 F (36.7 C)-98.1 F (36.7 C)] 98.1 F (36.7 C) (01/22 0743) Pulse Rate:  [63-75] 75 (01/22 0743) Resp:  [18-20] 18 (01/22 0743) BP: (124-132)/(66-95) 132/78 (01/22 0743) SpO2:  [99 %-100 %] 99 % (01/22 0743)  Physical Exam:  General: alert and cooperative Lochia: appropriate Uterine Fundus: firm    Data Review Recent Labs    09/14/20 0120 09/15/20 0709  HGB 9.5* 9.3*  HCT 31.2* 30.6*    Assessment/Plan: Active Problems:   Dichorionic diamniotic twin pregnancy in third trimester   Anemia of pregnancy in third trimester   Discharge home Mom to "room in" as long as possible until babies are discharged.  She is breastfeeding.  See D/C summary by Valentino Saxon MD   [name], M.D. 09/16/2020 9:31 AM

## 2020-09-17 ENCOUNTER — Ambulatory Visit: Payer: Self-pay

## 2020-09-17 NOTE — Lactation Note (Signed)
This note was copied from a baby's chart. Lactation Consultation Note  Patient Name: Alexis Clark ANVBT'Y Date: 09/17/2020 Reason for consult: Follow-up assessment Age:26 hours  Lactation Rounds: LC to the room. Mother stated baby had just completed a feed. Mother stated baby was latched well, had a good suction and many swallows at breast. Mother had also pumped 60ml's and it was sitting at bedside. LC labeled the milk and took it to baby B in the SCN. Mother has no further questions today about feeds. Baby A will discharge today.   Maternal Data Formula Feeding for Exclusion: No  Interventions Interventions: Breast feeding basics reviewed;DEBP;Expressed milk;Hand express    Consult Status Consult Status: PRN Follow-up type: In-patient    Zerek Litsey D Danikah Budzik 09/17/2020, 10:39 AM

## 2020-09-19 ENCOUNTER — Encounter: Payer: 59 | Admitting: Obstetrics and Gynecology

## 2020-09-19 LAB — SURGICAL PATHOLOGY

## 2020-09-20 ENCOUNTER — Other Ambulatory Visit: Payer: 59

## 2020-09-24 IMAGING — US US OB EACH ADDL GEST<[ID]
1 series · 14 of 28 positions shown · non-contrast
Comparison: Ultrasound dated 02/02/2020.

CLINICAL DATA: 23-year-old pregnant female with vaginal bleeding.
LMP: 12/18/2019 corresponding to an estimated gestational age of 8
weeks, 0 days.

EXAM:
TWIN OBSTETRIC <14WK US AND TRANSVAGINAL OB US

[Series 1: early ob us · arterial · 14 of 120 slices shown]
[im 5/120]
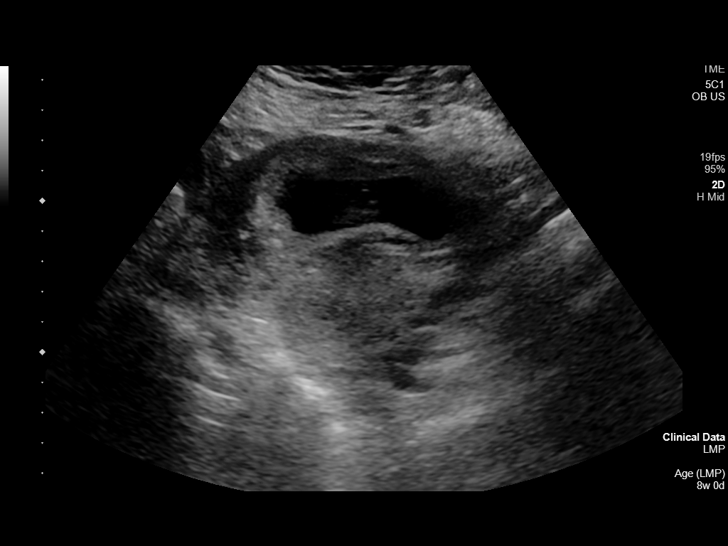
[im 14/120]
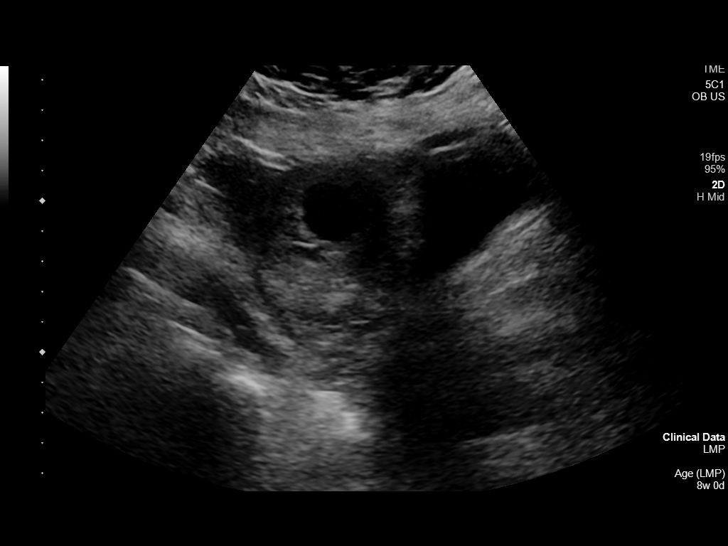
[im 23/120]
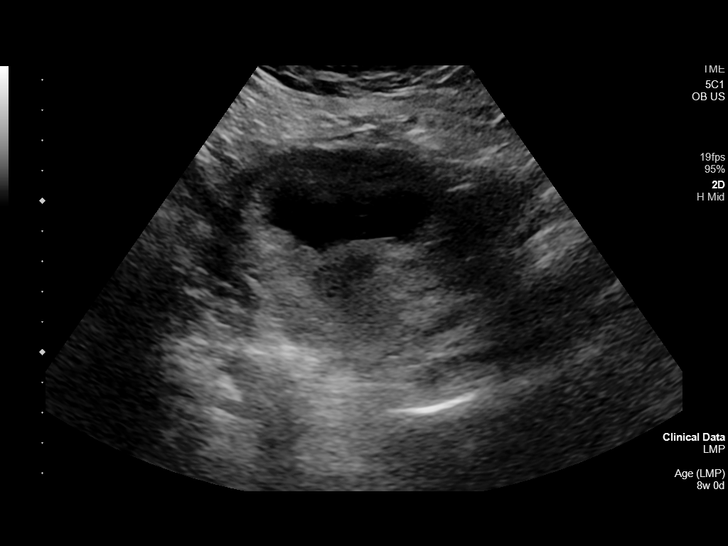
[im 31/120]
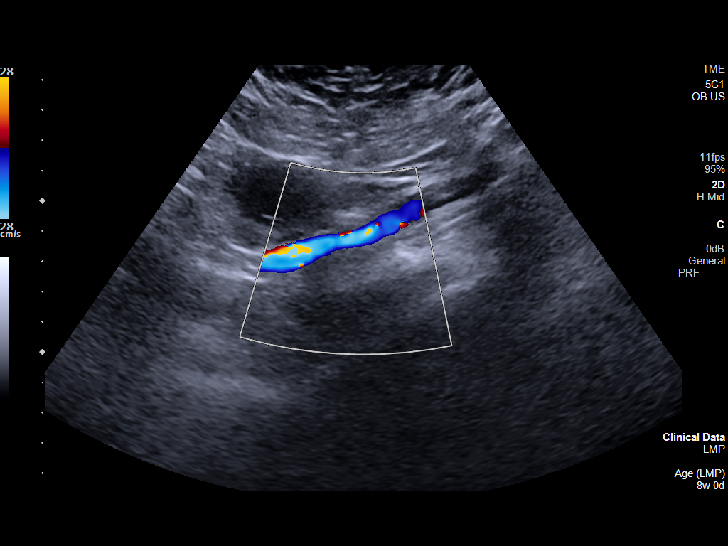
[im 40/120]
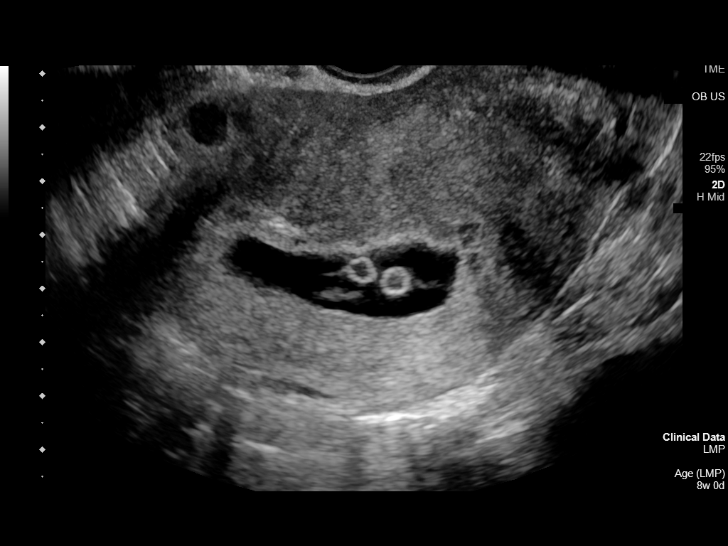
[im 49/120]
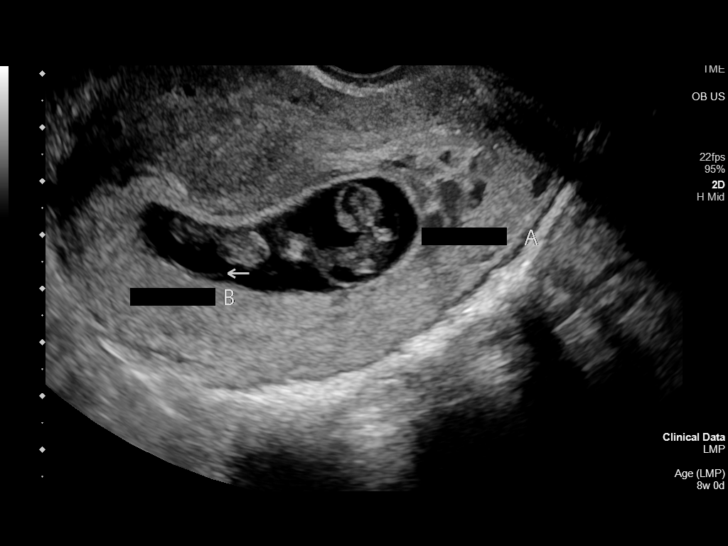
[im 58/120]
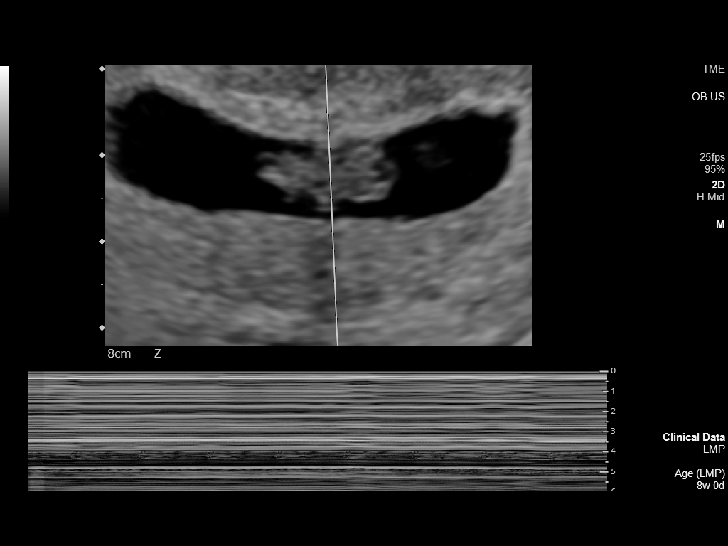
[im 67/120]
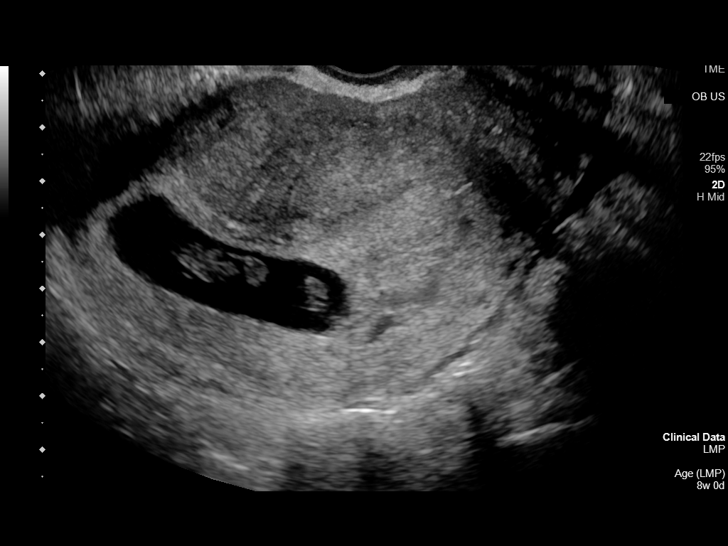
[im 75/120]
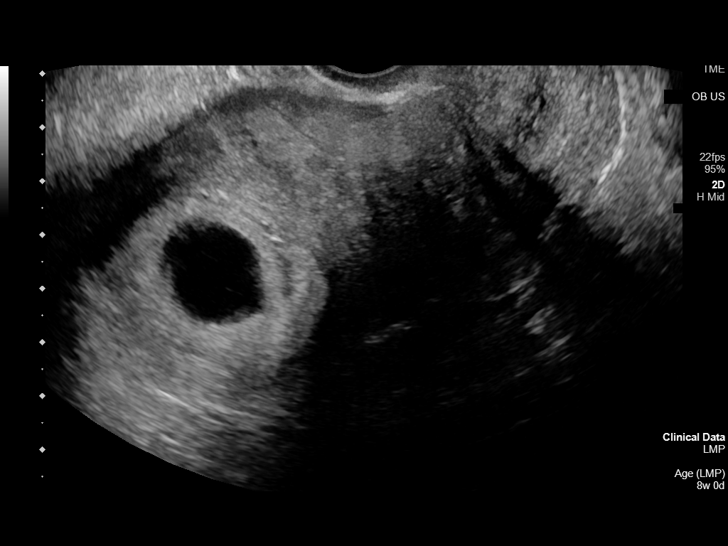
[im 84/120]
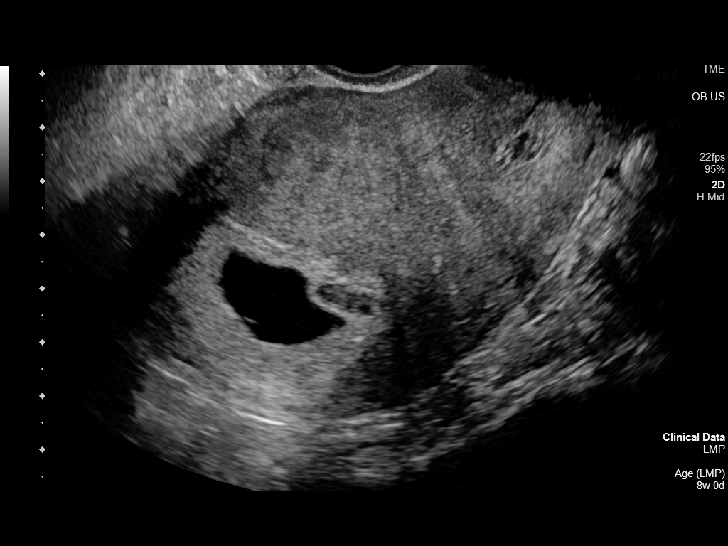
[im 93/120]
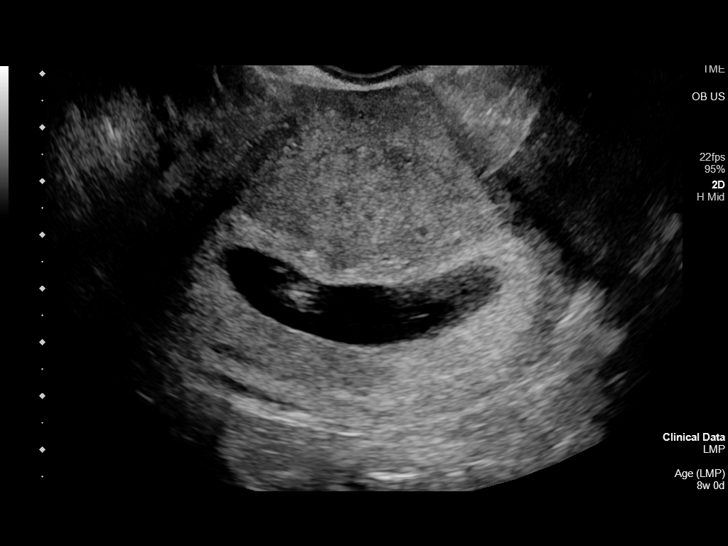
[im 102/120]
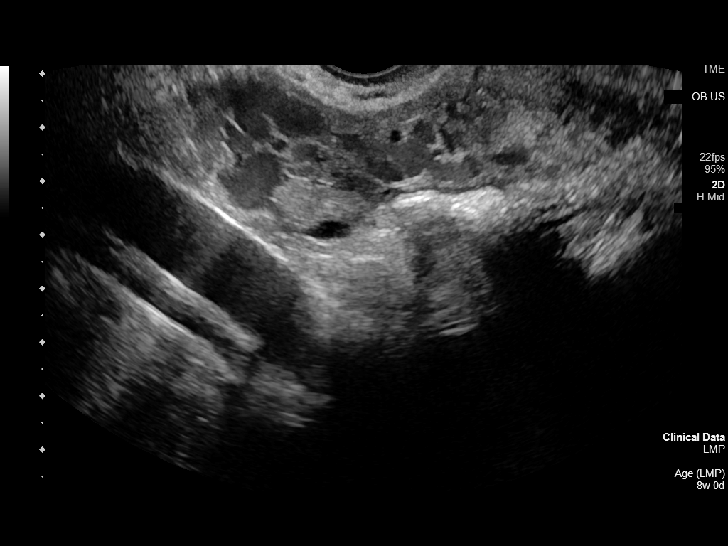
[im 111/120]
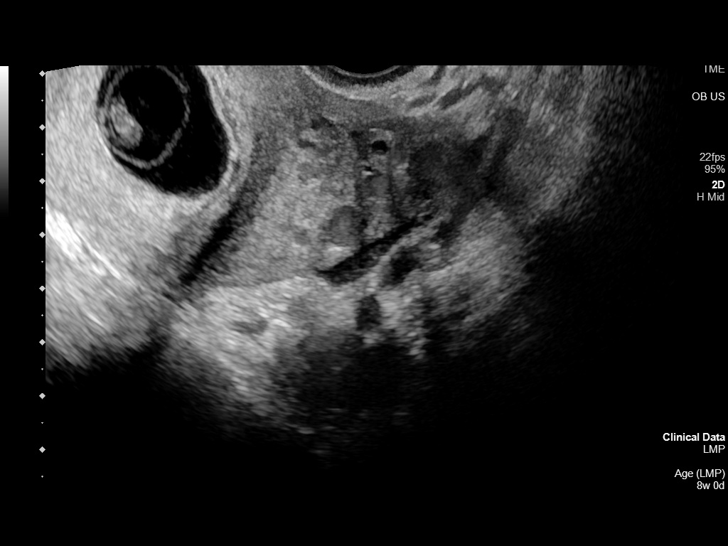
[im 120/120]
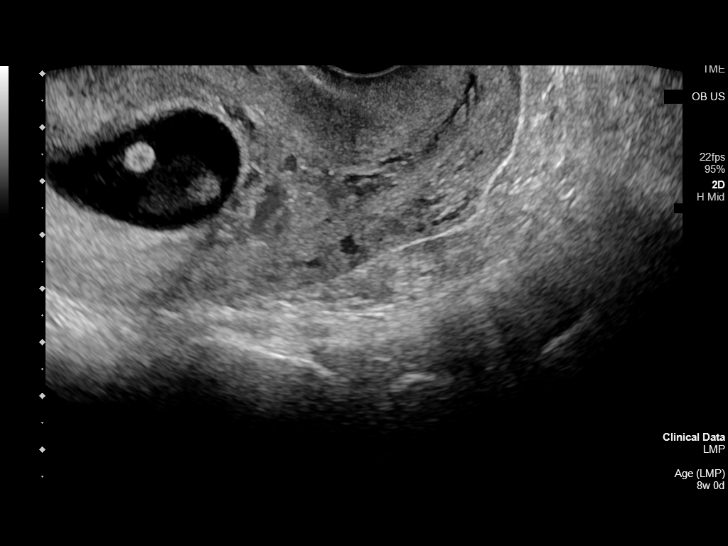

[14 of 28 positions shown; findings below may reference images not displayed]

FINDINGS: Number of IUPs:  2

Chorionicity/Amnionicity:  Monochorionic-diamniotic (thin membrane)

TWIN 1

Yolk sac:  Seen

Embryo:  Present

Cardiac Activity: Detected

Heart Rate: 158 bpm

CRL: 19 mm   8 w 3 d                  US EDC: 09/20/2020

TWIN 2

Yolk sac:  Seen

Embryo:  Detected

Cardiac Activity: Present

Heart Rate: 147 bpm

CRL: 17 mm   8 w 1 d                  US EDC: 09/22/2020

Subchorionic hemorrhage: Small subchorionic hemorrhage measuring up
to 15 mm.

Maternal uterus/adnexae: The maternal ovaries are unremarkable.
IMPRESSION: Twin live intrauterine pregnancy.

## 2020-10-06 DIAGNOSIS — Z0289 Encounter for other administrative examinations: Secondary | ICD-10-CM

## 2020-10-20 ENCOUNTER — Telehealth: Payer: Self-pay

## 2020-10-20 NOTE — Telephone Encounter (Signed)
Pt is aware that her FMLA forms has been faxed and sent to her insurance company.

## 2020-10-26 ENCOUNTER — Encounter: Payer: 59 | Admitting: Obstetrics and Gynecology

## 2020-11-14 ENCOUNTER — Other Ambulatory Visit: Payer: Self-pay

## 2020-11-14 ENCOUNTER — Encounter: Payer: Self-pay | Admitting: Obstetrics and Gynecology

## 2020-11-14 ENCOUNTER — Ambulatory Visit (INDEPENDENT_AMBULATORY_CARE_PROVIDER_SITE_OTHER): Payer: 59 | Admitting: Obstetrics and Gynecology

## 2020-11-14 DIAGNOSIS — Z30017 Encounter for initial prescription of implantable subdermal contraceptive: Secondary | ICD-10-CM

## 2020-11-14 DIAGNOSIS — O9081 Anemia of the puerperium: Secondary | ICD-10-CM

## 2020-11-14 LAB — POCT URINE PREGNANCY: Preg Test, Ur: NEGATIVE

## 2020-11-14 NOTE — Progress Notes (Signed)
  PT is present today for her postpartum visit. Pt stated that she is breastfeeding and have  had sexually intercourse recently with no protections. Pt stated that she would like to get the Nexplanon for birth control UPT-NEG. EPDS=0.  Pt stated that she is doing well no complaints.

## 2020-11-14 NOTE — Patient Instructions (Addendum)
NEXPLANON PLACEMENT POST-PROCEDURE INSTRUCTIONS  1. You may take Ibuprofen, Aleve or Tylenol for pain if needed.  Pain should resolve within in 24 hours.  2. You may have intercourse after 24 hours.  If you using this for birth control, it is effective immediately.  3. You need to call if you have any fever, heavy bleeding, or redness at insertion site. Irregular bleeding is common the first several months after having a Nexplanonplaced. You do not need to call for this reason unless you are concerned.  4. Shower or bathe as normal.  You can remove the bandage after 24 hours.    Etonogestrel implant What is this medicine? ETONOGESTREL (et oh noe JES trel) is a contraceptive (birth control) device. It is used to prevent pregnancy. It can be used for up to 3 years. This medicine may be used for other purposes; ask your health care provider or pharmacist if you have questions. COMMON BRAND NAME(S): Implanon, Nexplanon What should I tell my health care provider before I take this medicine? They need to know if you have any of these conditions:  abnormal vaginal bleeding  blood vessel disease or blood clots  breast, cervical, endometrial, ovarian, liver, or uterine cancer  diabetes  gallbladder disease  heart disease or recent heart attack  high blood pressure  high cholesterol or triglycerides  kidney disease  liver disease  migraine headaches  seizures  stroke  tobacco smoker  an unusual or allergic reaction to etonogestrel, anesthetics or antiseptics, other medicines, foods, dyes, or preservatives  pregnant or trying to get pregnant  breast-feeding How should I use this medicine? This device is inserted just under the skin on the inner side of your upper arm by a health care professional. Talk to your pediatrician regarding the use of this medicine in children. Special care may be needed. Overdosage: If you think you have taken too much of this medicine contact a  poison control center or emergency room at once. NOTE: This medicine is only for you. Do not share this medicine with others. What if I miss a dose? This does not apply. What may interact with this medicine? Do not take this medicine with any of the following medications:  amprenavir  fosamprenavir This medicine may also interact with the following medications:  acitretin  aprepitant  armodafinil  bexarotene  bosentan  carbamazepine  certain medicines for fungal infections like fluconazole, ketoconazole, itraconazole and voriconazole  certain medicines to treat hepatitis, HIV or AIDS  cyclosporine  felbamate  griseofulvin  lamotrigine  modafinil  oxcarbazepine  phenobarbital  phenytoin  primidone  rifabutin  rifampin  rifapentine  St. John's wort  topiramate This list may not describe all possible interactions. Give your health care provider a list of all the medicines, herbs, non-prescription drugs, or dietary supplements you use. Also tell them if you smoke, drink alcohol, or use illegal drugs. Some items may interact with your medicine. What should I watch for while using this medicine? This product does not protect you against HIV infection (AIDS) or other sexually transmitted diseases. You should be able to feel the implant by pressing your fingertips over the skin where it was inserted. Contact your doctor if you cannot feel the implant, and use a non-hormonal birth control method (such as condoms) until your doctor confirms that the implant is in place. Contact your doctor if you think that the implant may have broken or become bent while in your arm. You will receive a user card from  your health care provider after the implant is inserted. The card is a record of the location of the implant in your upper arm and when it should be removed. Keep this card with your health records. What side effects may I notice from receiving this medicine? Side  effects that you should report to your doctor or health care professional as soon as possible:  allergic reactions like skin rash, itching or hives, swelling of the face, lips, or tongue  breast lumps, breast tissue changes, or discharge  breathing problems  changes in emotions or moods  coughing up blood  if you feel that the implant may have broken or bent while in your arm  high blood pressure  pain, irritation, swelling, or bruising at the insertion site  scar at site of insertion  signs of infection at the insertion site such as fever, and skin redness, pain or discharge  signs and symptoms of a blood clot such as breathing problems; changes in vision; chest pain; severe, sudden headache; pain, swelling, warmth in the leg; trouble speaking; sudden numbness or weakness of the face, arm or leg  signs and symptoms of liver injury like dark yellow or brown urine; general ill feeling or flu-like symptoms; light-colored stools; loss of appetite; nausea; right upper belly pain; unusually weak or tired; yellowing of the eyes or skin  unusual vaginal bleeding, discharge Side effects that usually do not require medical attention (report to your doctor or health care professional if they continue or are bothersome):  acne  breast pain or tenderness  headache  irregular menstrual bleeding  nausea This list may not describe all possible side effects. Call your doctor for medical advice about side effects. You may report side effects to FDA at 1-800-FDA-1088. Where should I keep my medicine? This drug is given in a hospital or clinic and will not be stored at home. NOTE: This sheet is a summary. It may not cover all possible information. If you have questions about this medicine, talk to your doctor, pharmacist, or health care provider.  2021 Elsevier/Gold Standard (2019-05-25 11:33:04)

## 2020-11-14 NOTE — Progress Notes (Signed)
OBSTETRICS POSTPARTUM CLINIC PROGRESS NOTE  Subjective:     Alexis Clark is a 25 y.o. G5P1011 female who presents for a routine check/ postpartum visit. She is 9 weeks postpartum following a spontaneous vaginal delivery of mono-di twins. I have fully reviewed the prenatal and intrapartum course. The delivery was at 38 gestational weeks due to growth restriction of twin B.  Anesthesia: epidural. Postpartum course has been well. Baby's course has been well. Baby is feeding by breast. Bleeding: patient has not resumed menses, with No LMP recorded. Bowel function is normal. Bladder function is normal. Patient is sexually active (unprotected). Contraception method desired is Nexplanon. Postpartum depression screening: negative.  The following portions of the patient's history were reviewed and updated as appropriate: allergies, current medications, past family history, past medical history, past social history, past surgical history and problem list.  Review of Systems A comprehensive review of systems was negative except for: Hematologic/lymphatic: positive for easy bruising recently. No previous history. Also denies h/o bleeding gums, prolonged clotting after injury.  Objective:    BP 111/68   Pulse 82   Ht 5\' 5"  (1.651 m)   Wt 189 lb 6.4 oz (85.9 kg)   Breastfeeding Yes   BMI 31.52 kg/m   General:  alert and no distress   Breasts:  inspection negative, no nipple discharge or bleeding, no masses or nodularity palpable  Lungs: clear to auscultation bilaterally  Heart:  regular rate and rhythm, S1, S2 normal, no murmur, click, rub or gallop  Abdomen: soft, non-tender; bowel sounds normal; no masses,  no organomegaly.     Vulva:  normal  Vagina: normal vagina, no discharge, exudate, lesion, or erythema  Cervix:  no cervical motion tenderness and no lesions  Corpus: normal size, contour, position, consistency, mobility, non-tender  Adnexa:  normal adnexa and no mass, fullness, tenderness   Rectal Exam: Not performed.         Labs:  Lab Results  Component Value Date   HGB 9.3 (L) 09/15/2020     Assessment:    1. Postpartum care following vaginal delivery   2. Postpartum anemia   3. Lactating mother   4. Nexplanon insertion     Plan:   1. Contraception: Nexplanon. UPT negative today. However has had sex recently (unprotected). Considered use of Plan B however advised that this may significantly disturb her milk supply. Offered emergency contraception with IUD placement however patient declines use of IUD. Discussed that the likelihood of ovulation was low as she is still exclusively breastfeeding.  Will place Nexplanon today. Advised to repeat UPT at home in 2 weeks and use backup method until then.  2. Will check Hgb for h/o anemia.  3. Notice given to return to work.  4. Follow up in: 4- 6 months for annual exam, or sooner as needed.      Nexplanon Insertion Procedure Patient identified, informed consent performed, consent signed.   Patient does understand that irregular bleeding is a very common side effect of this medication. She was advised to have backup contraception for one week after placement. Pregnancy test in clinic today was negative.  Appropriate time out taken.  Patient's left arm was prepped and draped in the usual sterile fashion. The ruler used to measure and mark insertion area.  Patient was prepped with alcohol swab and then injected with 3 ml of 1% lidocaine. After ~ 7 minutes patient was noted to still have sensation in the arm and so an additional 2 ml  of lidocaine was administered.  She was then prepped with betadine, Nexplanon removed from packaging,  Device confirmed in needle, then inserted full length of needle and withdrawn per handbook instructions. Nexplanon was able to palpated in the patient's arm; patient palpated the insert herself. There was minimal blood loss.  Patient insertion site covered with guaze and a pressure bandage to reduce  any bruising.  The patient tolerated the procedure well and was given post procedure instructions.    Exp: 02/21/23.  Lot: [id number]   Hildred Laser, MD [facility]

## 2020-11-15 LAB — HEMOGLOBIN AND HEMATOCRIT, BLOOD
Hematocrit: 40.3 % (ref 34.0–46.6)
Hemoglobin: 12.6 g/dL (ref 11.1–15.9)

## 2021-01-26 ENCOUNTER — Ambulatory Visit (LOCAL_COMMUNITY_HEALTH_CENTER): Payer: 59 | Admitting: Family Medicine

## 2021-01-26 ENCOUNTER — Other Ambulatory Visit: Payer: Self-pay

## 2021-01-26 ENCOUNTER — Ambulatory Visit: Payer: Medicaid Other

## 2021-01-26 VITALS — BP 97/63 | Ht 65.0 in | Wt 195.0 lb

## 2021-01-26 DIAGNOSIS — Z5321 Procedure and treatment not carried out due to patient leaving prior to being seen by health care provider: Secondary | ICD-10-CM

## 2021-01-26 NOTE — Progress Notes (Signed)
Pt left before seen by provider.  Patient was not able to be found.    Wendi Snipes, FNP

## 2021-01-26 NOTE — Progress Notes (Signed)
Patient had Nexplanon inserted on 11/14/20 at [facility]

## 2021-02-09 ENCOUNTER — Encounter: Payer: 59 | Admitting: Obstetrics and Gynecology

## 2021-03-01 ENCOUNTER — Encounter: Payer: Self-pay | Admitting: Obstetrics and Gynecology

## 2021-03-20 NOTE — Progress Notes (Deleted)
Nexplanon removal

## 2021-03-21 ENCOUNTER — Encounter: Payer: 59 | Admitting: Obstetrics and Gynecology

## 2021-03-21 DIAGNOSIS — Z3046 Encounter for surveillance of implantable subdermal contraceptive: Secondary | ICD-10-CM

## 2021-03-29 ENCOUNTER — Ambulatory Visit: Payer: 59

## 2021-04-16 ENCOUNTER — Encounter: Payer: Self-pay | Admitting: Certified Nurse Midwife

## 2021-04-16 ENCOUNTER — Other Ambulatory Visit: Payer: Self-pay

## 2021-04-16 ENCOUNTER — Ambulatory Visit (INDEPENDENT_AMBULATORY_CARE_PROVIDER_SITE_OTHER): Payer: 59 | Admitting: Certified Nurse Midwife

## 2021-04-16 VITALS — BP 115/73 | HR 65 | Ht 65.0 in | Wt 204.2 lb

## 2021-04-16 DIAGNOSIS — Z3046 Encounter for surveillance of implantable subdermal contraceptive: Secondary | ICD-10-CM

## 2021-04-16 NOTE — Progress Notes (Signed)
Alexis Clark is a 25 y.o. year old G25P2012 female here for Nexplanon removal states she had it put in march 2022 and has had weight gain.  She was given informed consent for removal  of her Nexplanon.   BP 115/73   Pulse 65   Ht 5\' 5"  (1.651 m)   Wt 204 lb 3.2 oz (92.6 kg)   LMP  (LMP Unknown)   BMI 33.98 kg/m  No LMP recorded (lmp unknown). No results found for this or any previous visit (from the past 24 hour(s)).   Appropriate time out taken. Nexplanon site identified.  Area prepped in usual sterile fashon. Two cc's of 2% lidocaine was used to anesthetize the area. A small stab incision was made right beside the implant on the distal portion.  The Nexplanon rod was grasped using hemostats and removed intact without difficulty.  Steri-strips and a pressure bandage was applied.  There was less than 3 cc blood loss. There were no complications.  The patient tolerated the procedure well.  She was instructed to keep the area clean and dry, remove pressure bandage in 24 hours, and keep insertion site covered with the steri-strips for 3-5 days.She was looking for non hormonal birth control option. Discussed use of para guard IUD and phexi, natural family planning, and condom use.. Pt expresses interest in tubal. Discussed procedure. Recommend follow up with DR. Cherry for scheduling and consultation if she would like to move forward with procedure. .   She verbalizes and agrees.  Follow-up PRN problems.  Melody , CNM

## 2021-04-16 NOTE — Patient Instructions (Signed)
Nexplanon Instructions   Keep bandage clean and dry for 24 hours  May use ice/Tylenol/Ibuprofen for soreness or pain  If you develop fever, drainage or increased warmth from incision site-contact office immediately

## 2021-05-10 NOTE — Progress Notes (Deleted)
    GYNECOLOGY PROGRESS NOTE  Subjective:    Patient ID: Alexis Clark, female    DOB: 01/26/1996, 25 y.o.   MRN: [id number]  HPI  Patient is a 25 y.o. V7O1607 female who presents for consult for BTL.  {Common ambulatory SmartLinks:19316}  Review of Systems {ros; complete:30496}   Objective:   currently breastfeeding. There is no height or weight on file to calculate BMI. General appearance: {general exam:16600} Abdomen: {abdominal exam:16834} Pelvic: {pelvic exam:16852::"cervix normal in appearance","external genitalia normal","no adnexal masses or tenderness","no cervical motion tenderness","rectovaginal septum normal","uterus normal size, shape, and consistency","vagina normal without discharge"} Extremities: {extremity exam:5109} Neurologic: {neuro exam:17854}   Assessment:   No diagnosis found.   Plan:   There are no diagnoses linked to this encounter.     Hildred Laser, MD

## 2021-05-11 ENCOUNTER — Encounter: Payer: Self-pay | Admitting: Obstetrics and Gynecology

## 2021-05-11 ENCOUNTER — Encounter: Payer: 59 | Admitting: Obstetrics and Gynecology

## 2021-05-17 ENCOUNTER — Encounter: Payer: 59 | Admitting: Obstetrics and Gynecology

## 2021-06-19 NOTE — Progress Notes (Deleted)
    GYNECOLOGY PROGRESS NOTE  Subjective:    Patient ID: SARAN LAVIOLETTE, female    DOB: 1996-03-24, 25 y.o.   MRN: [id number]  HPI  Patient is a 25 y.o. [id number] female who presents for Paraguard Insertion. She had Nexplanon removed on 04/16/2021 due to weight gain. She would like to have paraguard inserted today. Her No LMP recorded.   {Common ambulatory SmartLinks:19316}  Review of Systems {ros; complete:30496}   Objective:   currently breastfeeding. There is no height or weight on file to calculate BMI. General appearance: {general exam:16600} Abdomen: {abdominal exam:16834} Pelvic: {pelvic exam:16852::"cervix normal in appearance","external genitalia normal","no adnexal masses or tenderness","no cervical motion tenderness","rectovaginal septum normal","uterus normal size, shape, and consistency","vagina normal without discharge"} Extremities: {extremity exam:5109} Neurologic: {neuro exam:17854}   Assessment:   1. Encounter for initial prescription of intrauterine contraceptive device (IUD)      Plan:   There are no diagnoses linked to this encounter.     Hildred Laser, MD [facility].

## 2021-06-20 ENCOUNTER — Encounter: Payer: 59 | Admitting: Obstetrics and Gynecology

## 2021-06-20 DIAGNOSIS — Z30014 Encounter for initial prescription of intrauterine contraceptive device: Secondary | ICD-10-CM

## 2021-06-22 ENCOUNTER — Encounter: Payer: Self-pay | Admitting: Obstetrics and Gynecology

## 2021-07-13 ENCOUNTER — Encounter: Payer: 59 | Admitting: Obstetrics and Gynecology

## 2021-07-18 ENCOUNTER — Encounter: Payer: Self-pay | Admitting: Family Medicine

## 2021-07-18 ENCOUNTER — Ambulatory Visit (LOCAL_COMMUNITY_HEALTH_CENTER): Payer: 59 | Admitting: Family Medicine

## 2021-07-18 ENCOUNTER — Other Ambulatory Visit: Payer: Self-pay

## 2021-07-18 VITALS — BP 107/66 | HR 66 | Temp 97.4°F | Ht 65.0 in | Wt 206.4 lb

## 2021-07-18 DIAGNOSIS — Z3009 Encounter for other general counseling and advice on contraception: Secondary | ICD-10-CM

## 2021-07-18 DIAGNOSIS — Z124 Encounter for screening for malignant neoplasm of cervix: Secondary | ICD-10-CM

## 2021-07-18 DIAGNOSIS — Z30011 Encounter for initial prescription of contraceptive pills: Secondary | ICD-10-CM | POA: Diagnosis not present

## 2021-07-18 LAB — PREGNANCY, URINE: Preg Test, Ur: NEGATIVE

## 2021-07-18 MED ORDER — NORETHINDRONE 0.35 MG PO TABS: 1.0000 | ORAL_TABLET | Freq: Every day | ORAL | 1 refills | Status: DC

## 2021-07-18 NOTE — Progress Notes (Signed)
Client reports no menses since Nexplanon removed 04/16/2021. Reports has been having unprotected intercourse since Nexplanon removal with last sex end of 05/2021. Per verbal order of Elveria Rising FNP-BC, obtain US now. Jossie Ng, RN UPT negative. Jossie Ng, RN

## 2021-07-21 LAB — IGP, RFX APTIMA HPV ASCU: PAP Smear Comment: 0

## 2021-07-24 NOTE — Progress Notes (Signed)
[facility] problem visit  [facility]  Subjective:  Alexis Clark is a 25 y.o. being seen today for   Chief Complaint  Patient presents with   Annual Exam   Contraception    Pt in clinic today for physical and Birth control options. Pt had PP visit at [facility] on 11/14/20.  Pt had nexplanon was placed at [facility] visit on 11/14/20 and had it removed on 04/16/21 d/t weight gain.  No LMP since 04/16/21.  Last sex was 05/2021    Does the patient have a current or past history of drug use? No   No components found for: HCV]   Health Maintenance Due  Topic Date Due   COVID-19 Vaccine (1) Never done   HPV VACCINES (1 - 2-dose series) Never done   Hepatitis C Screening  Never done   INFLUENZA VACCINE  Never done   PAP-Cervical Cytology Screening  06/02/2021    ROS  The following portions of the patient's history were reviewed and updated as appropriate: allergies, current medications, past family history, past medical history, past social history, past surgical history and problem list. Problem list updated.   See flowsheet for other program required questions.  Objective:   Vitals:   07/18/21 1504  BP: 107/66  Pulse: 66  Temp: (!) 97.4 F (36.3 C)  Weight: 206 lb 6.4 oz (93.6 kg)  Height: 5\' 5"  (1.651 m)    Physical Exam Vitals reviewed.  Constitutional:      Appearance: Normal appearance.  HENT:     Head: Normocephalic and atraumatic.  Pulmonary:     Effort: Pulmonary effort is normal.  Genitourinary:    General: Normal vulva.     Rectum: Normal.     Comments: External genitalia without, lice, nits, erythema, edema , lesions or inguinal adenopathy. Vagina with normal mucosa and discharge and pH equals 4.  Cervix without visual lesions, uterus firm, mobile, non-tender, no masses, CMT adnexal fullness or tenderness.   Musculoskeletal:     Cervical back: Normal range of motion and neck supple.  Skin:    General:  Skin is warm and dry.  Neurological:     Mental Status: She is alert and oriented to person, place, and time.  Psychiatric:        Mood and Affect: Mood normal.        Behavior: Behavior normal.      Assessment and Plan:  Alexis Clark is a 25 y.o. female presenting to the [facility] Department for a Women's Health problem visit  1. Family planning Pt in clinic today for physical and birth control.  Pt has PP visit 10/2020 at [facility].   Pt had nexplanon placed PP and removed 03/2021 d/t weight gain.   Pt desires IUD. UNable to have IUD place today.   Discussed with pt about continuity of care and [facility] d/t prenatal care and previous birth control from other agency.   Pt desires to continue care at [facility].  Reviewed PP visit, meets criteria for exam.   Pt last sex was 05/2021 PT negative.     Pt started on OCP's until IUD placement - Pregnancy, urine   2. Encounter for Papanicolaou smear of cervix Pt last pap was 06/02/2018 Pap today  - IGP, rfx Aptima HPV ASCU  3. Encounter for initial prescription of contraceptive pills  - norethindrone (MICRONOR) 0.35 MG tablet; Take 1 tablet (0.35  mg total) by mouth daily.  Dispense: 28 tablet; Refill: 1   No follow-ups on file.  No future appointments.  Wendi Snipes, FNP

## 2021-09-18 ENCOUNTER — Ambulatory Visit: Payer: 59

## 2021-10-10 ENCOUNTER — Telehealth: Payer: Self-pay | Admitting: Family Medicine

## 2021-10-10 ENCOUNTER — Other Ambulatory Visit: Payer: Self-pay

## 2021-10-10 NOTE — Telephone Encounter (Signed)
Pt had her PE last November, but states she was only given a 52-month supply of BC Pills until making up her mind about the IUD. Now she needs a refill of the pills. Please confirm and call the Patient or clerk to schedule this appt. Thanks

## 2021-10-11 ENCOUNTER — Other Ambulatory Visit: Payer: Self-pay | Admitting: Family Medicine

## 2021-10-11 DIAGNOSIS — Z3041 Encounter for surveillance of contraceptive pills: Secondary | ICD-10-CM

## 2021-10-11 MED ORDER — NORETHINDRONE 0.35 MG PO TABS: 1.0000 | ORAL_TABLET | Freq: Every day | ORAL | 11 refills | Status: DC

## 2021-10-11 NOTE — Progress Notes (Signed)
Pt called and decided she wants to continue with OCP's .    RX sent to listed pharmacy.    1. Encounter for surveillance of contraceptive pills - norethindrone (MICRONOR) 0.35 MG tablet; Take 1 tablet (0.35 mg total) by mouth daily.  Dispense: 28 tablet; Refill: 11  Patient called to inform of RX sent.   Wendi Snipes, FNP

## 2021-10-15 ENCOUNTER — Other Ambulatory Visit: Payer: Self-pay | Admitting: Unknown Physician Specialty

## 2021-10-15 DIAGNOSIS — R221 Localized swelling, mass and lump, neck: Secondary | ICD-10-CM

## 2021-10-30 ENCOUNTER — Other Ambulatory Visit: Payer: 59

## 2021-11-07 ENCOUNTER — Ambulatory Visit (LOCAL_COMMUNITY_HEALTH_CENTER): Payer: 59

## 2021-11-07 ENCOUNTER — Other Ambulatory Visit: Payer: Self-pay

## 2021-11-07 VITALS — BP 105/66 | Ht 65.0 in | Wt 215.0 lb

## 2021-11-07 DIAGNOSIS — Z3041 Encounter for surveillance of contraceptive pills: Secondary | ICD-10-CM | POA: Diagnosis not present

## 2021-11-07 DIAGNOSIS — Z3009 Encounter for other general counseling and advice on contraception: Secondary | ICD-10-CM | POA: Diagnosis not present

## 2021-11-07 NOTE — Progress Notes (Signed)
In Nurse Clinic requesting ocp (Micronor) refill. Pt explains she has 3 pills left in pack. Unsure whether she has any refills. Per epic, E Rx for Micronor was sent to her local pharmacy [facility] [place]) on 10/11/2021 by Hilario Quarry, FNP. During visit today, pt placed phone call to pharmacy regarding ocp refills and [facility] informed her that refills were available. Pt plans to pick up her ocp at her pharmacy. ? ?Pt reports missing ocp on Sundays because she is out of her routine and then doubles up the next day.   ?Last missed pills were 10/28/2021 and 11/04/2021.  ?Last sex 11/03/2021 and previously on 10/01/2021. ?LMP 10/20/2021 and normal. Reports spotting today.  ? ?Consult A White, FNP who recommends RN to counsel pt on importance of taking ocp daily at exact time.  ?Recommends use of condoms as back up. Pt may want to consider other bcm options since trouble remembering to take ocp.  ?May take home UPT and stop ocp if positive and notify [facility].  ? ?RN discussed  provider recommendations. RN counseled pt to set alert on cell phone as reminder to take ocp. ?Pt has annual physical scheduled 11/15/2021 and advised to keep appt. Questions answered and reports understanding. Josie Saunders, RN ? ?

## 2021-11-15 ENCOUNTER — Other Ambulatory Visit: Payer: Self-pay

## 2021-11-15 ENCOUNTER — Encounter: Payer: Self-pay | Admitting: Advanced Practice Midwife

## 2021-11-15 ENCOUNTER — Ambulatory Visit (LOCAL_COMMUNITY_HEALTH_CENTER): Payer: 59 | Admitting: Advanced Practice Midwife

## 2021-11-15 VITALS — BP 103/62 | Ht 65.0 in | Wt 213.8 lb

## 2021-11-15 DIAGNOSIS — Z3009 Encounter for other general counseling and advice on contraception: Secondary | ICD-10-CM | POA: Diagnosis not present

## 2021-11-15 DIAGNOSIS — Z3041 Encounter for surveillance of contraceptive pills: Secondary | ICD-10-CM

## 2021-11-15 DIAGNOSIS — R591 Generalized enlarged lymph nodes: Secondary | ICD-10-CM

## 2021-11-15 MED ORDER — NORETHINDRONE 0.35 MG PO TABS: 1.0000 | ORAL_TABLET | Freq: Every day | ORAL | 13 refills | Status: DC

## 2021-11-15 NOTE — Progress Notes (Signed)
Patient here for PE . Last PE 3 /2022 per patient and pap 07/18/2021. Pap was NIL. Patient has refills of Micronor. Has questions about feeling like she's eating too much. Declines blood testing today.Jenetta Downer, RN  ?

## 2021-11-15 NOTE — Progress Notes (Signed)
[facility] DEPARTMENT ?Family Planning Clinic ?[address] [employer] ?Main Number: [phone] ? ? ? ?Family Planning Visit- Initial Visit ? ?Subjective:  ?Alexis Clark is a 26 y.o. MHF exsmoker G3P2013 (8, 13 mo twins)  being seen today for an initial annual visit and to discuss reproductive life planning.  The patient is currently using Oral Contraceptive for pregnancy prevention. Patient reports she/her/hers  does not want a pregnancy in the next year.   ? ?she/her/hers report they are looking for a method that provides Method they can control starting/stopping ? ?Patient has the following medical conditions has Traumatic rupture of symphysis pubis; Zone II fracture of sacrum (HCC); Pilonidal cyst with abscess; Pilonidal cyst without abscess; Irregular menstrual cycle; Obesity, unspecified BMI=35.5; Enlargement of thyroid; and Lymphadenopathy x 11 mo on their problem list. ? ?Chief Complaint  ?Patient presents with  ? Annual Exam  ? ? ?Patient reports here for physical and more Micronor. Last physical 11/14/20 at pp exam at [facility] when Nexplanon placed. Nexplanon removed 04/16/21. Breastfeeding 76 mo old twins. LMP 10/20/21. Last sex 11/12/21 without condom; with current partner off and on x 2 years. Living with husband and 3 kids. Working 40 hrs/wk. Last cig 2021. Last MJ 5 years ago. Last ETOH 11/10/21 (3 beers) qo weekend. Last dental exam 08/2021. ? ?Patient denies vaping, cigars ? ?Body mass index is 35.58 kg/m?. - Patient is eligible for diabetes screening based on BMI and age >72?  not applicable ?HA1C ordered? not applicable ? ?Patient reports 1  partner/s in last year. Desires STI screening?  No - declines bloodwork; lab closed so cannot do wet mount ? ?Has patient been screened once for HCV in the past?  No ? No results found for: HCVAB ? ?Does the patient have current drug use (including MJ), have a partner with drug use, and/or has been incarcerated since last result? No  ?If yes--  Screen for HCV through [facility] ?  ?Does the patient meet criteria for HBV testing? No ? ?Criteria:  ?-Household, sexual or needle sharing contact with HBV ?-History of drug use ?-HIV positive ?-Those with known Hep C ? ? ?Health Maintenance Due  ?Topic Date Due  ? COVID-19 Vaccine (1) Never done  ? Hepatitis C Screening  Never done  ? INFLUENZA VACCINE  Never done  ? PAP-Cervical Cytology Screening  06/02/2021  ? ? ?Review of Systems  ?Constitutional:  Positive for weight loss (7 lb wt gain since 06/2021 not exercising; suggestions given).  ? ?The following portions of the patient's history were reviewed and updated as appropriate: allergies, current medications, past family history, past medical history, past social history, past surgical history and problem list. Problem list updated. ? ? ?See flowsheet for other program required questions. ? ?Objective:  ? ?Vitals:  ? 11/15/21 1615  ?BP: 103/62  ?Weight: 213 lb 12.8 oz (97 kg)  ?Height: 5\' 5"  (1.651 m)  ? ? ?Physical Exam ?Constitutional:   ?   Appearance: Normal appearance. She is obese.  ?HENT:  ?   Head: Normocephalic and atraumatic.  ?   Mouth/Throat:  ?   Mouth: Mucous membranes are moist.  ?Eyes:  ?   Conjunctiva/sclera: Conjunctivae normal.  ?Cardiovascular:  ?   Rate and Rhythm: Normal rate and regular rhythm.  ?Pulmonary:  ?   Effort: Pulmonary effort is normal.  ?   Breath sounds: Normal breath sounds.  ?Chest:  ?   Comments: Lactating and nursing twins ?Abdominal:  ?  Palpations: Abdomen is soft.  ?   Comments: Soft without masses or tenderness, increased adipose  ?Genitourinary: ?   General: Normal vulva.  ?   Exam position: Lithotomy position.  ?   Vagina: Vaginal discharge (white creamy leukorrhea, ph<4.5) present.  ?   Cervix: Normal.  ?   Uterus: Normal.   ?   Adnexa: Right adnexa normal and left adnexa normal.  ?   Rectum: Normal.  ?Musculoskeletal:     ?   General: Normal range of motion.  ?   Cervical back: Normal range of motion and neck  supple.  ?Lymphadenopathy:  ?   Cervical: Cervical adenopathy (1 enlarged right cervical lymph node pea sized and nontender x 11 mo) present.  ?Skin: ?   General: Skin is warm and dry.  ?Neurological:  ?   Mental Status: She is alert.  ?Psychiatric:     ?   Mood and Affect: Mood normal.  ? ? ? ? ?Assessment and Plan:  ?Alexis Clark is a 26 y.o. female presenting to the [facility] Department for an initial annual wellness/contraceptive visit ? ?Contraception counseling: Reviewed options based on patient desire and reproductive life plan. Patient is interested in Oral Contraceptive. This was provided to the patient today.  if not why not clearly documented ? ?Risks, benefits, and typical effectiveness rates were reviewed.  Questions were answered.  Written information was also given to the patient to review.   ? ?The patient will follow up in  1 years for surveillance.  The patient was told to call with any further questions, or with any concerns about this method of contraception.  Emphasized use of condoms 100% of the time for STI prevention. ? ?Need for ECP was assessed.  Reviewed options and patient desired No method of ECP, declined all   ? ?1. Family planning ?Immunization nurse consult ?Suggestions given for weight loss ?- norethindrone (MICRONOR) 0.35 MG tablet; Take 1 tablet (0.35 mg total) by mouth daily.  Dispense: 28 tablet; Refill: 13 ?- Chlamydia/Gonorrhea [facility] ? ?2. Encounter for surveillance of contraceptive pills ?E-rx'd Micronor ? ?3. Lymphadenopathy x 11 mo ?Encouraged pt to reschedule CT apt scheduled by E[facility]) ? ? ? ? ?No follow-ups on file. ? ?No future appointments. ? ?Alberteen Spindle, CNM ? ?

## 2022-06-11 ENCOUNTER — Ambulatory Visit
Admission: RE | Admit: 2022-06-11 | Discharge: 2022-06-11 | Disposition: A | Discharge status: Home / Self Care | Payer: 59 | Source: Ambulatory Visit | Attending: Unknown Physician Specialty | Admitting: Unknown Physician Specialty

## 2022-06-11 DIAGNOSIS — R221 Localized swelling, mass and lump, neck: Secondary | ICD-10-CM

## 2022-06-11 MED ORDER — IOPAMIDOL (ISOVUE-300) INJECTION 61%
75.0000 mL | Freq: Once | INTRAVENOUS | Status: AC | PRN
  Administered 2022-06-11: 75 mL via INTRAVENOUS

## 2022-06-17 ENCOUNTER — Other Ambulatory Visit: Payer: Self-pay | Admitting: Unknown Physician Specialty

## 2022-06-17 DIAGNOSIS — K118 Other diseases of salivary glands: Secondary | ICD-10-CM

## 2022-06-18 ENCOUNTER — Other Ambulatory Visit (HOSPITAL_COMMUNITY): Payer: Self-pay | Admitting: Radiology

## 2022-06-18 DIAGNOSIS — R221 Localized swelling, mass and lump, neck: Secondary | ICD-10-CM

## 2022-06-18 NOTE — Progress Notes (Signed)
Patient for US guided Core RT Submandibular Mass Biopsy on Wed 06/19/22, I called and spoke with the patient on the phone and gave pre-procedure instructions. The pt was made aware to be here at 7:30a, that we were probably going to use Local but to still be NPO after MN prior to procedure as well as have a driver on stand by post procedure/recovery/discharge. Pt stated understanding. Called 06/18/22

## 2022-06-19 ENCOUNTER — Ambulatory Visit
Admission: RE | Admit: 2022-06-19 | Discharge: 2022-06-19 | Disposition: A | Discharge status: Home / Self Care | Payer: 59 | Source: Ambulatory Visit | Attending: Unknown Physician Specialty | Admitting: Unknown Physician Specialty

## 2022-06-19 DIAGNOSIS — K118 Other diseases of salivary glands: Secondary | ICD-10-CM | POA: Insufficient documentation

## 2022-06-19 DIAGNOSIS — R221 Localized swelling, mass and lump, neck: Secondary | ICD-10-CM | POA: Insufficient documentation

## 2022-06-19 MED ORDER — LIDOCAINE HCL (PF) 1 % IJ SOLN
8.0000 mL | Freq: Once | INTRAMUSCULAR | Status: AC
  Administered 2022-06-19: 8 mL via INTRADERMAL

## 2022-06-19 NOTE — Procedures (Signed)
Interventional Radiology Procedure:   Indications: Right submandibular mass  Procedure: US guided core biopsy of right submandibular mass  Findings: 3 cores of right submandibular hypoechoic lesion  Complications: No immediate complications noted.     EBL: Minimal  Plan: Discharge to home.   Icker Swigert R. Anselm Pancoast, MD  Pager: [phone]

## 2022-06-20 LAB — SURGICAL PATHOLOGY

## 2022-07-22 ENCOUNTER — Inpatient Hospital Stay: Admission: RE | Admit: 2022-07-22 | Payer: 59 | Source: Ambulatory Visit

## 2022-08-26 NOTE — L&D Delivery Note (Signed)
Delivery Note At 1:51 AM a viable child was delivered via Vaginal, Spontaneous (Presentation: Left Occiput Anterior).  APGAR: , ; weight  .   Placenta status: Spontaneous, Intact.  Cord: 3 vessels with the following complications: None.    Anesthesia: None Episiotomy: None Lacerations: Labial, right. Hemostatic. Not repaired.  Est. Blood Loss (mL): 300  Entered the room and fetal head was already out. Uncomplicated delivery of viable female infant. A nuchal cord was not present. The shoulders and body were delivered without difficulty. Infant was placed on patient's chest. Gowned and gloved in the usual fashion. Umbilical cord was clamped times two after 30 seconds and cut. Cord blood was collected and gases were not sent to lab. Active management of the 3rd stage of labor ensued. The placenta was delivered intact and without difficulty. The lower uterine segment was swept of all clots and noted to be firm. Inspection of the birth canal showed a small right labial laceration which was hemostatic and did not require repair. Sponge and needle counts were performed with RN and were correct prior to exiting the room. At time of MD leaving the patient was in stable condition.    Mom to postpartum.  Baby to Couplet care / Skin to Skin.  Kylan Liberati V Tobi Groesbeck 06/05/2023, 2:11 AM

## 2022-08-27 ENCOUNTER — Other Ambulatory Visit: Payer: Self-pay

## 2022-08-27 ENCOUNTER — Encounter
Admission: RE | Admit: 2022-08-27 | Discharge: 2022-08-27 | Disposition: A | Discharge status: Home / Self Care | Payer: 59 | Source: Ambulatory Visit | Attending: Unknown Physician Specialty | Admitting: Unknown Physician Specialty

## 2022-08-27 DIAGNOSIS — Z01812 Encounter for preprocedural laboratory examination: Secondary | ICD-10-CM

## 2022-08-27 HISTORY — DX: Unspecified asthma, uncomplicated: J45.909

## 2022-08-27 HISTORY — DX: Anemia, unspecified: D64.9

## 2022-08-27 NOTE — Patient Instructions (Addendum)
Your procedure is scheduled on: 09/03/22 - Tuesday Report to the Registration Desk on the 1st floor of the [facility]. To find out your arrival time, please call [phone] between 1PM - 3PM on: 09/02/22 - Monday If your arrival time is 6:00 am, do not arrive prior to that time as the [facility] entrance doors do not open until 6:00 am.  REMEMBER: Instructions that are not followed completely may result in serious medical risk, up to and including death; or upon the discretion of your surgeon and anesthesiologist your surgery may need to be rescheduled.  Do not eat food or drink any liquids after midnight the night before surgery.  No gum chewing, lozengers or hard candies.  TAKE THESE MEDICATIONS THE MORNING OF SURGERY WITH A SIP OF WATER: NONE  One week prior to surgery: Stop Anti-inflammatories (NSAIDS) such as Advil, Aleve, Ibuprofen, Motrin, Naproxen, Naprosyn and Aspirin based products such as Excedrin, Goodys Powder, BC Powder.  Stop ANY OVER THE COUNTER supplements until after surgery.  You may however, continue to take Tylenol if needed for pain up until the day of surgery.  No Alcohol for 24 hours before or after surgery.  No Smoking including e-cigarettes for 24 hours prior to surgery.  No chewable tobacco products for at least 6 hours prior to surgery.  No nicotine patches on the day of surgery.  Do not use any "recreational" drugs for at least a week prior to your surgery.  Please be advised that the combination of cocaine and anesthesia may have negative outcomes, up to and including death. If you test positive for cocaine, your surgery will be cancelled.  On the morning of surgery brush your teeth with toothpaste and water, you may rinse your mouth with mouthwash if you wish. Do not swallow any toothpaste or mouthwash.  Do not wear jewelry, make-up, hairpins, clips or nail polish.  Do not wear lotions, powders, or perfumes.   Do not shave body from the neck  down 48 hours prior to surgery just in case you cut yourself which could leave a site for infection.  Also, freshly shaved skin may become irritated if using the CHG soap.  Contact lenses, hearing aids and dentures may not be worn into surgery.  Do not bring valuables to the hospital. [facility] is not responsible for any missing/lost belongings or valuables.   Notify your doctor if there is any change in your medical condition (cold, fever, infection).  Wear comfortable clothing (specific to your surgery type) to the hospital.  After surgery, you can help prevent lung complications by doing breathing exercises.  Take deep breaths and cough every 1-2 hours. Your doctor may order a device called an Incentive Spirometer to help you take deep breaths. When coughing or sneezing, hold a pillow firmly against your incision with both hands. This is called "splinting." Doing this helps protect your incision. It also decreases belly discomfort.  If you are being admitted to the hospital overnight, leave your suitcase in the car. After surgery it may be brought to your room.  If you are being discharged the day of surgery, you will not be allowed to drive home. You will need a responsible adult (18 years or older) to drive you home and stay with you that night.   If you are taking public transportation, you will need to have a responsible adult (18 years or older) with you. Please confirm with your physician that it is acceptable to use public transportation.  Please call the [facility] Dept. at [phone] if you have any questions about these instructions.  Surgery Visitation Policy:  Patients undergoing a surgery or procedure may have two family members or support persons with them as long as the person is not COVID-19 positive or experiencing its symptoms.   Inpatient Visitation:    Visiting hours are 7 a.m. to 8 p.m. Up to four visitors are allowed at one time in a  patient room. The visitors may rotate out with other people during the day. One designated support person (adult) may remain overnight.  Due to an increase in RSV and influenza rates and associated hospitalizations, children ages 60 and under will not be able to visit patients in [facility]. Masks continue to be strongly recommended.

## 2022-09-03 ENCOUNTER — Ambulatory Visit: Admission: RE | Admit: 2022-09-03 | Payer: 59 | Source: Home / Self Care | Admitting: Unknown Physician Specialty

## 2022-09-03 ENCOUNTER — Encounter: Admission: RE | Payer: Self-pay | Source: Home / Self Care

## 2022-09-03 SURGERY — EXCISION, SUBMANDIBULAR GLAND
Anesthesia: General | Laterality: Right

## 2022-10-22 DIAGNOSIS — Z3493 Encounter for supervision of normal pregnancy, unspecified, third trimester: Secondary | ICD-10-CM | POA: Insufficient documentation

## 2023-03-01 ENCOUNTER — Other Ambulatory Visit: Payer: Self-pay

## 2023-03-01 ENCOUNTER — Emergency Department
Admission: EM | Admit: 2023-03-01 | Discharge: 2023-03-01 | Disposition: A | Discharge status: Home / Self Care | Payer: 59 | Attending: Emergency Medicine | Admitting: Emergency Medicine

## 2023-03-01 ENCOUNTER — Encounter: Payer: Self-pay | Admitting: Radiology

## 2023-03-01 ENCOUNTER — Emergency Department: Payer: 59

## 2023-03-01 DIAGNOSIS — Z3A27 27 weeks gestation of pregnancy: Secondary | ICD-10-CM | POA: Diagnosis not present

## 2023-03-01 DIAGNOSIS — Y9389 Activity, other specified: Secondary | ICD-10-CM | POA: Diagnosis not present

## 2023-03-01 DIAGNOSIS — O26892 Other specified pregnancy related conditions, second trimester: Secondary | ICD-10-CM | POA: Diagnosis present

## 2023-03-01 DIAGNOSIS — M25511 Pain in right shoulder: Secondary | ICD-10-CM | POA: Insufficient documentation

## 2023-03-01 DIAGNOSIS — O99891 Other specified diseases and conditions complicating pregnancy: Secondary | ICD-10-CM | POA: Insufficient documentation

## 2023-03-01 DIAGNOSIS — W010XXA Fall on same level from slipping, tripping and stumbling without subsequent striking against object, initial encounter: Secondary | ICD-10-CM | POA: Diagnosis not present

## 2023-03-01 NOTE — Discharge Instructions (Signed)
Your x-ray was negative for any acute bony injury, though it is possible that you have a soft tissue or muscular injury as we discussed.  Please follow-up with orthopedics if your symptoms persist.  You may wear the sling to help with your pain.  However, remember to remove your arm from the sling multiple times per day, to perform the gentle range of motion exercises that we discussed in order to prevent adhesive capsulitis.  Is a pleasure caring for you today.

## 2023-03-01 NOTE — ED Triage Notes (Signed)
Pt states yesterday she was playing with her children and fell landing on her right arm. PT states her left shoulder down to her elbow is very painful. She is unable to raise her arm over her head due to the pain.

## 2023-03-01 NOTE — ED Triage Notes (Signed)
Fetal heart tones 155 BPM. Pt states baby moving as usual.

## 2023-03-01 NOTE — ED Triage Notes (Signed)
Pt is [redacted] weeks pregnant

## 2023-03-01 NOTE — ED Provider Notes (Signed)
[facility] Provider Note    Event Date/Time   First MD Initiated Contact with Patient 03/01/23 1252     (approximate)   History   Arm Pain and Fall during pregnancy   HPI  Alexis Clark is a 27 y.o. female who is currently [redacted] weeks pregnant who presents today for evaluation of right shoulder pain.  Patient reports that she was [place] the beach yesterday when she was carrying her daughter, and tripped and landed directly on her right shoulder.  She reports that she has had pain since that time.  She specifically has pain with overhead motions.  She reports that she was unable to brush her hair with that arm this morning.  Patient Active Problem List   Diagnosis Date Noted   Lymphadenopathy x 11 mo 11/15/2021   Irregular menstrual cycle 06/29/2018   Obesity, unspecified BMI=35.5 11/03/2017   Pilonidal cyst without abscess    Pilonidal cyst with abscess 09/26/2017   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/23/2015   Zone II fracture of sacrum (HCC) 03/14/2015   Enlargement of thyroid 11/08/2014          Physical Exam   Triage Vital Signs: ED Triage Vitals  Enc Vitals Group     BP 03/01/23 1217 127/74     Pulse Rate 03/01/23 1217 77     Resp 03/01/23 1217 18     Temp 03/01/23 1217 98.3 F (36.8 C)     Temp Source 03/01/23 1217 Oral     SpO2 03/01/23 1217 99 %     Weight 03/01/23 1218 240 lb (108.9 kg)     Height 03/01/23 1218 5\' 5"  (1.651 m)     Head Circumference --      Peak Flow --      Pain Score 03/01/23 1218 10     Pain Loc --      Pain Edu? --      Excl. in GC? --     Most recent vital signs: Vitals:   03/01/23 1217  BP: 127/74  Pulse: 77  Resp: 18  Temp: 98.3 F (36.8 C)  SpO2: 99%    Physical Exam Vitals and nursing note reviewed.  Constitutional:      General: Awake and alert. No acute distress.    Appearance: Normal appearance. The patient is normal weight.  HENT:     Head: Normocephalic and atraumatic.     Mouth:  Mucous membranes are moist.  Eyes:     General: PERRL. Normal EOMs        Right eye: No discharge.        Left eye: No discharge.     Conjunctiva/sclera: Conjunctivae normal.  Cardiovascular:     Rate and Rhythm: Normal rate and regular rhythm.     Pulses: Normal pulses.  Pulmonary:     Effort: Pulmonary effort is normal. No respiratory distress.     Breath sounds: Normal breath sounds.  Abdominal:     Abdomen is soft. There is no abdominal tenderness. No rebound or guarding. No distention. Musculoskeletal:        General: No swelling. Normal range of motion.     Cervical back: Normal range of motion and neck supple. No midline cervical spine tenderness.  Full range of motion of neck.  Negative Spurling test.  Negative Lhermitte sign.  Normal strength and sensation in bilateral upper extremities. Normal grip strength bilaterally.  Normal intrinsic muscle function of the hand bilaterally.  Normal  radial pulses bilaterally. Right shoulder: No obvious deformity, swelling, ecchymosis, or erythema No clavicular or AC joint tenderness Able to actively and passively forward flex and abduct at shoulder to 90 degrees only, negative drop arm test Normal internal and external rotation against resistance Normal ROM at elbow and wrist Normal resisted pronation and supination 2+ radial pulse Normal grip strength Normal intrinsic hand muscle function Skin:    General: Skin is warm and dry.     Capillary Refill: Capillary refill takes less than 2 seconds.     Findings: No rash.  Neurological:     Mental Status: The patient is awake and alert.      ED Results / Procedures / Treatments   Labs (all labs ordered are listed, but only abnormal results are displayed) Labs Reviewed - No data to display   EKG     RADIOLOGY I independently reviewed and interpreted imaging and agree with radiologists findings.     PROCEDURES:  Critical Care performed:   Procedures   MEDICATIONS  ORDERED IN ED: Medications - No data to display   IMPRESSION / MDM / ASSESSMENT AND PLAN / ED COURSE  I reviewed the triage vital signs and the nursing notes.   Differential diagnosis includes, but is not limited to, fracture, dislocation, rotator cuff injury, bursitis, labral tear.  Patient is awake and alert, hemodynamically stable and afebrile and afebrile.  She is neurovascularly intact.  She has no cervical spine tenderness, full range of motion of her neck, negative Spurling and Lhermitte sign, not consistent with cervical spine etiology.  She has decreased range of motion of her shoulder secondary to pain.  There is no obvious deformity.  She is unable to forward flex or abduct past 90 degrees, raising the possibility of a rotator cuff injury.  She was unable to cooperate with O'Brien's, empty can, or SLAP test given her discomfort.  She still using her arm to use her cell phone.  X-ray obtained is negative for any bony injury.  She denies any pregnancy concerns today.  She has normal fetal heart tones, and has normal fetal movement.  She denies any other injury sustained.  Pain management options are limited due to her pregnancy.  She was given a sling for comfort, though advised of the risk of adhesive capsulitis and advised to perform gentle range of motion exercises multiple times per day, these were demonstrated to her.  She was instructed to follow-up with orthopedics if her symptoms persist, and instructed to call them on Monday after the weekend.  In the meantime, we discussed strict return precautions the importance of close outpatient follow-up.  Patient understands and agrees with plan.  She was discharged in stable condition.     Patient's presentation is most consistent with acute complicated illness / injury requiring diagnostic workup.    FINAL CLINICAL IMPRESSION(S) / ED DIAGNOSES   Final diagnoses:  Acute pain of right shoulder     Rx / DC Orders   ED  Discharge Orders     None        Note:  This document was prepared using Dragon voice recognition software and may include unintentional dictation errors.   Jackelyn Hoehn, PA-C 03/01/23 1444    Sharyn Creamer, MD 03/01/23 [phone]

## 2023-05-22 NOTE — Progress Notes (Signed)
 ROUTINE OB VISIT  Ms. Mehan 27 y.o. [phone] (twins) at [redacted]w[redacted]d  Factors complicating this pregnancy: - S>D 38 cm at 34 weeks   - 05/05/23: EFW 2742g (28%)  - Obesity in pregnancy   S: No CTX, LOF, VB, DF. Good fetal movement.  O: BP 103/67   Pulse 76   Ht 165.1 cm (5' 5)   Wt (!) 114 kg (251 lb 6.4 oz)   LMP 08/21/2022 (Exact Date)   BMI 41.84 kg/m   Abdomen: Gravid, nontender; Fundal height: 39 Ext : no edema, no rashes  SVE 1/thick/-3  Fetal heart tones: 143 distinguished from mom  A/P (Problem list updated and notes commented) IUP at [redacted]w[redacted]d   Labor precautions and warning s/s reviewed [facility]'s daily RTC in 1 week ~~~~~~~~~~~~~~~~~~~~~~~~~~~~~~~~~~~~~~~~~~~~~~~~~~~~~~~~~~~~ This note is partially written by Marylynn Baptist, in the presence of and acting as the scribe of Dr. Heather Penton, who has reviewed, edited and added to the note to reflect her best personal medical judgment.  This note was generated in part with voice recognition software and I apologize for any typographical errors that were not detected and corrected.    Attestation Statement:   I personally performed the service. (TP)  BETHANY JOHNATHAN PENTON, MD

## 2023-05-29 NOTE — Progress Notes (Signed)
 Obstetrics & Gynecology Office Visit  Subjective  Alexis Clark is a 27 y.o. [phone] at 105w1d being seen today for ongoing prenatal care.  She is currently monitored for this low-risk pregnancy.  Patient's last menstrual period was 08/21/2022 (exact date). Estimated Date of Delivery: 05/28/23  Patient concerns today:  Reports she has been feeling contractions. States the contractions started around 12:00 pm and stopped around 6:00 pm yesterday.   Pt denies vaginal bleeding and leaking fluid. Endorses good fetal movement Pt denies HA, VD or RUQ pain.   Objective  BP 118/66   Ht 165.1 cm (5' 5)   Wt (!) 114.2 kg (251 lb 12.8 oz)   LMP 08/21/2022 (Exact Date)   BMI 41.90 kg/m    Pre-pregnant weight: 104.3 kg (230 lb)  TWG: 9.888 kg (21 lb 12.8 oz)  PE Chaperone note: A chaperone was included for sensitive portions of the exam- staff member name: - Alan Brasil, CMA  Chaperone present for pelvic exam.  Gen: NAD  Pulm: No use of accessory muscles, normal respirations Abdomen: Gravid, nontender Ext : No edema, no rashes.   Psych: Mood, insight, judgement intact SVE: .5/25/-3 FHT by doppler: 135 bpm distinguished from mom Fundal height: unable to assess due to BMI  Assessment   26 y.o. [id number] at [redacted]w[redacted]d by  05/28/2023, by Last Menstrual Period presenting for routine prenatal visit.   Plan   Problem list reviewed and/or updated  1. Encounter for supervision of other normal pregnancy in third trimester (HHS-HCC) -Labor plans reviewed. Desires to use un-medicated and IVPM as indicated for    pain management.  -Prolonged pregnancy protocol reviewed. Pt verbalized understanding. -Patient declines IOL. She does not want an induction unless she is medically indicated.  -Order for NST/AFI @ 41 weeks (06/05/2023).    - Kick counts reviewed with the patient in detail. Patient instructed to assess for     fetal activity daily at regular intervals. Counts to be done if decreased activity      noted. Patient instructed to contact the office if counts do not reveal adequate     movements. Labor precautions (ROM, vaginal bleeding, s/s labor) reviewed.  Pt     verbalized understanding.   2. Obesity in pregnancy, antepartum (HHS-HCC) -Body mass index is 41.9 kg/m. - Total weight gain: 9.888 kg (21 lb 12.8 oz)  - Total weight gain expected: 5 kg (11 lb)-9 kg (19 lb)   3. Flu vaccine  - Patient offered flu vaccine. She is undecided on flu vaccine   FKC reviewed and advised to call with bleeding, contractions or decreased fetal movement.    Return in about 1 week (around 06/05/2023) for Routine Prenatal.   Attestation Statement:   I personally performed the service, non-incident to. (WP)   JAZMINE SIMONE LIBOON, CNM

## 2023-06-03 ENCOUNTER — Observation Stay
Admission: EM | Admit: 2023-06-03 | Discharge: 2023-06-03 | Disposition: A | Discharge status: Home / Self Care | Payer: Medicaid Other | Source: Home / Self Care | Admitting: Obstetrics and Gynecology

## 2023-06-03 ENCOUNTER — Other Ambulatory Visit: Payer: Self-pay

## 2023-06-03 ENCOUNTER — Encounter: Payer: Self-pay | Admitting: Obstetrics and Gynecology

## 2023-06-03 DIAGNOSIS — Z87891 Personal history of nicotine dependence: Secondary | ICD-10-CM | POA: Insufficient documentation

## 2023-06-03 DIAGNOSIS — O480 Post-term pregnancy: Secondary | ICD-10-CM | POA: Insufficient documentation

## 2023-06-03 DIAGNOSIS — O471 False labor at or after 37 completed weeks of gestation: Principal | ICD-10-CM | POA: Insufficient documentation

## 2023-06-03 DIAGNOSIS — Z3A40 40 weeks gestation of pregnancy: Secondary | ICD-10-CM | POA: Insufficient documentation

## 2023-06-03 DIAGNOSIS — O479 False labor, unspecified: Principal | ICD-10-CM

## 2023-06-03 NOTE — Progress Notes (Signed)
Pt discharged home per order.   Pt stable and ambulatory and an After Visit Summary was printed and given to the patient. Discharge education completed with patient/family including follow up instructions, appointments, and medication list. Pt received labor and bleeding precautions. Patient able to verbPt discharged home via personal vehicle with support person.

## 2023-06-03 NOTE — OB Triage Note (Signed)
Patient is G4P3 is [redacted]w[redacted]d is seen at [facility], states contractions started around 11pm last night and got increasingly stronger through the night. Patent states no urinary symptoms, some mucous discharge, denies loss of fluid though. Patient rates CTX 6/10. Monitors applied FHR 120 . Provider to be notified.

## 2023-06-03 NOTE — Discharge Summary (Signed)
Alexis Clark is a 27 y.o. female. She is at [redacted]w[redacted]d gestation. Patient's last menstrual period was 08/21/2022. Estimated Date of Delivery: 06/02/23  Prenatal care site: [facility]  Chief complaint: Uterine Contractions   HPI: [name] presents to L&D with complaints of uterine contractions that started at 11:00pm last night. Reports contractions became stronger through the night. She states she has no urinary symptoms, some mucous discharge. Denis loss of fluid and vaginal bleeding. Patient rates pain of contractions a 6/10.  Factors complicating pregnancy: Uterine size- dates discrepancy Obesity in pregnancy  S: Resting comfortably. Occasional contractions, no VB, no LOF,  Active fetal movement.   Maternal Medical History:  Past Medical Hx:  has a past medical history of Anemia, Asthma, GERD (gastroesophageal reflux disease), History of asthma, Thyroid enlargement, and Twin pregnancy.    Past Surgical Hx:  has a past surgical history that includes Bony pelvis surgery; Ectopic pregnancy surgery; Pilonidal cyst excision (N/A, 10/07/2017); Salpingectomy (Left); and Hip fracture surgery (Left).   No Known Allergies   Prior to Admission medications   Medication Sig Start Date End Date Taking? Authorizing Provider  acetaminophen (TYLENOL) 500 MG tablet Take 1,000 mg by mouth every 6 (six) hours as needed for mild pain.    [provider]  Multiple Vitamins-Minerals (MULTIVITAMIN WITH MINERALS) tablet Take 1 tablet by mouth daily.    [provider]  norethindrone (MICRONOR) 0.35 MG tablet Take 1 tablet (0.35 mg total) by mouth daily. 11/15/21   Sciora, Austin Miles, CNM  pseudoephedrine-guaifenesin (MUCINEX D) 60-600 MG 12 hr tablet Take 1 tablet by mouth as needed for congestion.    [provider]    Social History: She  reports that she has quit smoking. Her smoking use included cigarettes. She has been exposed to tobacco smoke. She has never used  smokeless tobacco. She reports current alcohol use of about 3.0 standard drinks of alcohol per week. She reports that she does not currently use drugs after having used the following drugs: Marijuana.  Family History: family history includes Asthma in her mother; Breast cancer in her paternal aunt; Diabetes in her maternal grandfather and maternal grandmother; Down syndrome in her half-brother; Heart disease in her mother; Hyperlipidemia in her father; Multiple births in her daughter. ,no history of gyn cancers  Review of Systems: A full review of systems was performed and negative except as noted in the HPI.    O:  BP 109/68 (BP Location: Left Arm)   Pulse 73   Temp 98.5 F (36.9 C) (Oral)   Resp 18   LMP 08/21/2022  No results found for this or any previous visit (from the past 48 hour(s)).   Constitutional: NAD, AAOx3  HE/ENT: extraocular movements grossly intact, moist mucous membranes CV: RRR PULM: nl respiratory effort, CTABL Abd: gravid, non-tender, non-distended, soft  Ext: Non-tender, Nonedmeatous Psych: mood appropriate, speech normal Pelvic : deferred SVE: Dilation: 2 Effacement (%): 20 Cervical Position: Posterior Station: -3 Presentation: Vertex Exam by:: J. Garan Frappier, CNM   Fetal Monitor: Baseline: 115 bpm Variability: moderate Accels: Present Decels: none Toco: occasional   Category: I  Assessment: 27 y.o. [redacted]w[redacted]d here for antenatal surveillance during pregnancy.  Principle diagnosis: Uterine Contractions  The encounter diagnosis was Uterine contractions.   Plan: Labor: early latent labor Fetal Wellbeing: Reassuring Cat 1 tracing. Reactive NST  Patient cervix unchanged after 2 hours. Patient lives 20 minutes away and given the option to go home and return when in active labor or stay  another 2 hours for recheck of cervix. Patient chose to go home. Patient agreed with plan. Strict return precautions given to patient. Patient verbalized understanding.  Patient  discharged w/stable conditions.   ----- Roney Jaffe, CNM Certified Nurse Midwife [facility]

## 2023-06-04 ENCOUNTER — Inpatient Hospital Stay
Admission: EM | Admit: 2023-06-04 | Discharge: 2023-06-06 | DRG: 806 | Disposition: A | Discharge status: Home / Self Care | Payer: Medicaid Other

## 2023-06-04 ENCOUNTER — Other Ambulatory Visit: Payer: Self-pay

## 2023-06-04 ENCOUNTER — Encounter: Payer: Self-pay | Admitting: Obstetrics and Gynecology

## 2023-06-04 DIAGNOSIS — O99214 Obesity complicating childbirth: Secondary | ICD-10-CM | POA: Diagnosis present

## 2023-06-04 DIAGNOSIS — Z803 Family history of malignant neoplasm of breast: Secondary | ICD-10-CM | POA: Diagnosis not present

## 2023-06-04 DIAGNOSIS — O9081 Anemia of the puerperium: Secondary | ICD-10-CM | POA: Diagnosis not present

## 2023-06-04 DIAGNOSIS — Z3A40 40 weeks gestation of pregnancy: Secondary | ICD-10-CM | POA: Diagnosis not present

## 2023-06-04 DIAGNOSIS — Z8249 Family history of ischemic heart disease and other diseases of the circulatory system: Secondary | ICD-10-CM

## 2023-06-04 DIAGNOSIS — Z87891 Personal history of nicotine dependence: Secondary | ICD-10-CM

## 2023-06-04 DIAGNOSIS — O471 False labor at or after 37 completed weeks of gestation: Secondary | ICD-10-CM | POA: Diagnosis present

## 2023-06-04 DIAGNOSIS — O99213 Obesity complicating pregnancy, third trimester: Secondary | ICD-10-CM | POA: Diagnosis present

## 2023-06-04 DIAGNOSIS — Z833 Family history of diabetes mellitus: Secondary | ICD-10-CM | POA: Diagnosis not present

## 2023-06-04 DIAGNOSIS — Z825 Family history of asthma and other chronic lower respiratory diseases: Secondary | ICD-10-CM

## 2023-06-04 DIAGNOSIS — Z8279 Family history of other congenital malformations, deformations and chromosomal abnormalities: Secondary | ICD-10-CM

## 2023-06-04 DIAGNOSIS — D62 Acute posthemorrhagic anemia: Secondary | ICD-10-CM | POA: Diagnosis not present

## 2023-06-04 DIAGNOSIS — O480 Post-term pregnancy: Principal | ICD-10-CM | POA: Diagnosis present

## 2023-06-04 LAB — CBC
HCT: 36.6 % (ref 36.0–46.0)
Hemoglobin: 12 g/dL (ref 12.0–15.0)
MCH: 25.8 pg — ABNORMAL LOW (ref 26.0–34.0)
MCHC: 32.8 g/dL (ref 30.0–36.0)
MCV: 78.5 fL — ABNORMAL LOW (ref 80.0–100.0)
Platelets: 221 10*3/uL (ref 150–400)
RBC: 4.66 MIL/uL (ref 3.87–5.11)
RDW: 15.5 % (ref 11.5–15.5)
WBC: 7.7 10*3/uL (ref 4.0–10.5)
nRBC: 0 % (ref 0.0–0.2)

## 2023-06-04 LAB — TYPE AND SCREEN
ABO/RH(D): O POS
Antibody Screen: NEGATIVE

## 2023-06-04 MED ORDER — LACTATED RINGERS IV SOLN: INTRAVENOUS | Status: DC

## 2023-06-04 MED ORDER — SODIUM CHLORIDE 0.9% FLUSH
10.0000 mL | Freq: Two times a day (BID) | INTRAVENOUS | Status: DC
  Administered 2023-06-04: 10 mL via INTRAVENOUS

## 2023-06-04 MED ORDER — AMMONIA AROMATIC IN INHA
RESPIRATORY_TRACT | Status: AC
  Filled 2023-06-04: qty 10

## 2023-06-04 MED ORDER — OXYTOCIN BOLUS FROM INFUSION
333.0000 mL | Freq: Once | INTRAVENOUS | Status: AC
  Administered 2023-06-05: 333 mL via INTRAVENOUS

## 2023-06-04 MED ORDER — LIDOCAINE HCL (PF) 1 % IJ SOLN: 30.0000 mL | INTRAMUSCULAR | Status: DC | PRN

## 2023-06-04 MED ORDER — OXYCODONE-ACETAMINOPHEN 5-325 MG PO TABS: 1.0000 | ORAL_TABLET | ORAL | Status: DC | PRN

## 2023-06-04 MED ORDER — LIDOCAINE HCL (PF) 1 % IJ SOLN
INTRAMUSCULAR | Status: AC
  Filled 2023-06-04: qty 30

## 2023-06-04 MED ORDER — OXYTOCIN-SODIUM CHLORIDE 30-0.9 UT/500ML-% IV SOLN
2.5000 [IU]/h | INTRAVENOUS | Status: DC
  Administered 2023-06-05: 2.5 [IU]/h via INTRAVENOUS

## 2023-06-04 MED ORDER — LACTATED RINGERS IV SOLN: 500.0000 mL | INTRAVENOUS | Status: DC | PRN

## 2023-06-04 MED ORDER — OXYTOCIN-SODIUM CHLORIDE 30-0.9 UT/500ML-% IV SOLN
INTRAVENOUS | Status: AC
  Filled 2023-06-04: qty 500

## 2023-06-04 MED ORDER — OXYCODONE-ACETAMINOPHEN 5-325 MG PO TABS: 2.0000 | ORAL_TABLET | ORAL | Status: DC | PRN

## 2023-06-04 MED ORDER — ONDANSETRON HCL 4 MG/2ML IJ SOLN: 4.0000 mg | Freq: Four times a day (QID) | INTRAMUSCULAR | Status: DC | PRN

## 2023-06-04 MED ORDER — ACETAMINOPHEN 325 MG PO TABS
650.0000 mg | ORAL_TABLET | ORAL | Status: DC | PRN
  Administered 2023-06-04: 650 mg via ORAL
  Filled 2023-06-04: qty 2

## 2023-06-04 MED ORDER — FENTANYL CITRATE (PF) 100 MCG/2ML IJ SOLN
50.0000 ug | INTRAMUSCULAR | Status: DC | PRN
  Administered 2023-06-05: 50 ug via INTRAVENOUS
  Filled 2023-06-04: qty 2

## 2023-06-04 MED ORDER — OXYTOCIN 10 UNIT/ML IJ SOLN
INTRAMUSCULAR | Status: AC
  Filled 2023-06-04: qty 2

## 2023-06-04 MED ORDER — MISOPROSTOL 200 MCG PO TABS
ORAL_TABLET | ORAL | Status: AC
  Filled 2023-06-04: qty 4

## 2023-06-04 MED ORDER — SOD CITRATE-CITRIC ACID 500-334 MG/5ML PO SOLN: 30.0000 mL | ORAL | Status: DC | PRN

## 2023-06-04 NOTE — H&P (Signed)
OB History & Physical   History of Present Illness:   Chief Complaint: contractions  HPI:  Alexis Clark is a 27 y.o. [phone] female at [redacted]w[redacted]d by L/7 who presents to L&D for contractions.    Reports active fetal movement  Contractions: q3-40min LOF/SROM: denies Vaginal bleeding: denies  Factors complicating pregnancy:  - Obesity    Patient Active Problem List   Diagnosis Date Noted   Lymphadenopathy x 11 mo 11/15/2021   Irregular menstrual cycle 06/29/2018   Obesity, unspecified BMI=35.5 11/03/2017   Pilonidal cyst without abscess    Pilonidal cyst with abscess 09/26/2017   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/23/2015   Zone II fracture of sacrum (HCC) 03/14/2015   Enlargement of thyroid 11/08/2014    Maternal Medical History:   Past Medical History:  Diagnosis Date   Anemia    Asthma    GERD (gastroesophageal reflux disease)    OCC-NO MEDS   History of asthma    Thyroid enlargement    Twin pregnancy    Delivered 09/14/2020    Past Surgical History:  Procedure Laterality Date   BONY PELVIS SURGERY     AGE 33   ECTOPIC PREGNANCY SURGERY     REMOVED FALLOPIAN TUBE   HIP FRACTURE SURGERY Left    PILONIDAL CYST EXCISION N/A 10/07/2017   Procedure: CYST EXCISION PILONIDAL EXTENSIVE;  Surgeon: Henrene Dodge, MD;  Location: [facility] ORS;  Service: General;  Laterality: N/A;   SALPINGECTOMY Left     No Known Allergies  Prior to Admission medications   Medication Sig Start Date End Date Taking? Authorizing Provider  acetaminophen (TYLENOL) 500 MG tablet Take 1,000 mg by mouth every 6 (six) hours as needed for mild pain.   Yes [provider]  Multiple Vitamins-Minerals (MULTIVITAMIN WITH MINERALS) tablet Take 1 tablet by mouth daily.   Yes [provider]     Prenatal care site: [facility]  OB History  Gravida Para Term Preterm AB Living  4 2 2  0 1 3  SAB IAB Ectopic Multiple Live Births  0 0 1 1 3     # Outcome Date GA Lbr Len/2nd Weight Sex Type  Anes PTL Lv  4 Current           3A Term 09/14/20 [redacted]w[redacted]d  2835 g F Vag-Spont   LIV  3B Term 09/14/20 [redacted]w[redacted]d  1899 g F Vag-Spont   LIV  2 Term 02/09/13 [redacted]w[redacted]d  3657 g F Vag-Spont  N LIV     Name: Alexis Clark  1 Ectopic 2014 [redacted]w[redacted]d            Social History: She  reports that she has quit smoking. Her smoking use included cigarettes. She has been exposed to tobacco smoke. She has never used smokeless tobacco. She reports current alcohol use of about 3.0 standard drinks of alcohol per week. She reports that she does not currently use drugs after having used the following drugs: Marijuana.  Family History: family history includes Asthma in her mother; Breast cancer in her paternal aunt; Diabetes in her maternal grandfather and maternal grandmother; Down syndrome in her half-brother; Heart disease in her mother; Hyperlipidemia in her father; Multiple births in her daughter.   Review of Systems: A full review of systems was performed and negative except as noted in the HPI.     Physical Exam:  Vital Signs: BP 131/68 (BP Location: Left Arm)   Pulse 76   Temp 98.3 F (36.8 C) (Oral)   Resp 18  Ht 5\' 5"  (1.651 m)   Wt 113.9 kg   LMP 08/21/2022   BMI 41.77 kg/m   General: no acute distress.  HEENT: normocephalic, atraumatic Heart: regular rate  Lungs: normal respiratory effort Abdomen: soft, gravid, non-tender;  Extremities: non-tender, symmetric, trace edema bilaterally.   Neurologic: Alert & oriented x 3.    Cervix: 4.5/60/-2 per RN  FHT:  FHR: 125 bpm, variability: moderate,  accelerations:  Present,  decelerations:  Absent Category/reactivity:  Category I Toco: q28min   BSUS: cephalic   Pertinent Results:  Prenatal Labs: Blood type/Rh O POS   Antibody screen Negative    Rubella Immune    Varicella Immune  RPR Neg    HBsAg Neg   Hep C NR   HIV Neg    GC neg  Chlamydia neg  Genetic screening cfDNA negative   1 hour GTT 97  3 hour GTT N/a  GBS Neg    Most recent u/s:  05/05/23 EFW 2742g (28%), AC 34%  Assessment:  Alexis Clark is a 27 y.o. [id number] female at [redacted]w[redacted]d admitted in labor.   Plan:  1. Admit to Labor & Delivery - Admission to L&D- inpatient status - Discussed and consented the patient for a vaginal delivery or cesarean section. Discussed possible need for episiotomy and  operative vaginal delivery. Risks such as infection, bleeding, organ damage, anesthesia complications, and need for possible blood products were discussed; patient will accept products in case of an emergency. We discussed the risks of a blood transfusion, such as a 1 in 1.2-1.4 million chance of contracting HIV, Hep C.   2. Fetal Well being  - Fetal Tracing: cat 1 tracing - Presentation: cephalic confirmed on BSUS - Plan for continuous fetal monitoring  3. Routine OB: - Prenatal labs reviewed, as above - Rh + - GBS negative - CBC, T&S, RPR on admit - Regular diet, saline lock while in early labor - Vitals: afebrile, BP normal, HR normal  4. Monitoring of labor  - Contractions monitored with external toco - Pelvis proven to 3657g - Cervix: 4.5/60/-2 per RN. Plan for expectant management . Augmentation with oxytocin and AROM as appropriate  - Maternal pain control as desired; at this point wants to hold off on epidural - Anticipate vaginal delivery   5. Desires sterilization - Desires BTL. Patient 100% sure done childbearing.  - Medicaid consent signed on 03/06/23 - No past abdominal surgeries - Body mass index is 41.77 kg/m. - Will assess location of fundus postpartum to determine postpartum versus interval BTL  6. Post Partum Planning: - Infant feeding: breast feeding - Contraception: desires BTL - Flu vaccine: discuss postpartum - Tdap vaccine: received on 03/06/23 - RSV vaccine: did not receive  Romana Juniper, MD 06/04/23 6:22 PM

## 2023-06-04 NOTE — Progress Notes (Signed)
OB Labor Note   S: Doing well. Contractions are still about every 3-5 minutes. Has not had a chance to walk around much because people have been in and out.      O: BP 121/65 (BP Location: Left Arm)   Pulse 80   Temp 98.3 F (36.8 C) (Oral)   Resp 16   Ht 5\' 5"  (1.651 m)   Wt 113.9 kg   LMP 08/21/2022   BMI 41.77 kg/m  Temp (24hrs), Avg:98.3 F (36.8 C), Min:98.3 F (36.8 C), Max:98.3 F (36.8 C)    FHT: Baseline 125, mod variability, + accels, no decels. Cat 1  TOCO: not tracing well when moving around but q4-92min at times   Cervical Exam: declined, previously 4.5/60/-2 per RN   A/P: Alexis Clark is a 27 y.o. [phone] [redacted]w[redacted]d admitted for labor. - Cervical dilation: 4.5/60/-2 on admission. Declines augmentation at this time. Declines cervical recheck now.  - CEFM: Cat 1  - Vitals: afebrile, HR normal, BP normal - Rh + - GBS neg  - Pain tolerable.   #Desires sterilization - Desires BTL. Patient 100% sure done childbearing.  - Medicaid consent signed on 03/06/23 - No past abdominal surgeries - Body mass index is 41.77 kg/m. - Will assess location of fundus postpartum to determine postpartum versus interval BTL    Electronically signed by:  Romana Juniper, MD, 06/04/2023, 8:49 PM

## 2023-06-04 NOTE — Progress Notes (Signed)
Labor Progress Note  Alexis Clark is a 27 y.o. [phone] at [redacted]w[redacted]d by LMP admitted for active labor  Subjective: Pt reports contractions have spaced out and are not as strong.  She requests AROM  Objective: BP 121/65 (BP Location: Left Arm)   Pulse 80   Temp 98.3 F (36.8 C) (Oral)   Resp 16   Ht 5\' 5"  (1.651 m)   Wt 113.9 kg   LMP 08/21/2022   BMI 41.77 kg/m   Fetal Assessment: FHT:  FHR: 125 bpm, variability: moderate,  accelerations:  Present,  decelerations:  Absent Category/reactivity:  Category I UC:   irregular SVE:    Dilation: 5cm  Effacement: 80%  Station:  -3  Consistency: soft  Position: posterior  Membrane status: AROM at 2201 Amniotic color: Clear  Labs: Lab Results  Component Value Date   WBC 7.7 06/04/2023   HGB 12.0 06/04/2023   HCT 36.6 06/04/2023   MCV 78.5 (L) 06/04/2023   PLT 221 06/04/2023    Assessment / Plan: Augmentation of labor, progressing well AROM  Labor:  Augmentation Preeclampsia:   121/65 Fetal Wellbeing:  Category I Pain Control:  Labor support without medications I/D:   Afebrile, GBS neg, AROM x 0 hours Anticipated MOD:  NSVD  Jenifer E Suleman Gunning, CNM 06/04/2023, 10:08 PM

## 2023-06-04 NOTE — OB Triage Note (Signed)
Presents for complaint of contractions that have gotten stronger today. Pt was seen here yesterday. States some pinkish thick discharge. Denies any leaking of fluid. EFMS applied.

## 2023-06-05 ENCOUNTER — Inpatient Hospital Stay: Payer: Medicaid Other | Admitting: Anesthesiology

## 2023-06-05 ENCOUNTER — Encounter: Payer: Self-pay | Admitting: Obstetrics and Gynecology

## 2023-06-05 ENCOUNTER — Inpatient Hospital Stay: Payer: Medicaid Other | Admitting: Certified Registered"

## 2023-06-05 ENCOUNTER — Encounter: Admission: EM | Disposition: A | Discharge status: Home / Self Care | Payer: Self-pay | Source: Home / Self Care

## 2023-06-05 LAB — CBC
HCT: 32.5 % — ABNORMAL LOW (ref 36.0–46.0)
Hemoglobin: 10.7 g/dL — ABNORMAL LOW (ref 12.0–15.0)
MCH: 25.5 pg — ABNORMAL LOW (ref 26.0–34.0)
MCHC: 32.9 g/dL (ref 30.0–36.0)
MCV: 77.6 fL — ABNORMAL LOW (ref 80.0–100.0)
Platelets: 219 10*3/uL (ref 150–400)
RBC: 4.19 MIL/uL (ref 3.87–5.11)
RDW: 15.4 % (ref 11.5–15.5)
WBC: 14.1 10*3/uL — ABNORMAL HIGH (ref 4.0–10.5)
nRBC: 0 % (ref 0.0–0.2)

## 2023-06-05 LAB — RPR: RPR Ser Ql: NONREACTIVE

## 2023-06-05 SURGERY — LIGATION, FALLOPIAN TUBE, POSTPARTUM
Anesthesia: Choice | Laterality: Bilateral

## 2023-06-05 MED ORDER — ACETAMINOPHEN 325 MG PO TABS
650.0000 mg | ORAL_TABLET | Freq: Four times a day (QID) | ORAL | Status: DC
  Administered 2023-06-05 – 2023-06-06 (×5): 650 mg via ORAL
  Filled 2023-06-05 (×7): qty 2

## 2023-06-05 MED ORDER — SIMETHICONE 80 MG PO CHEW: 80.0000 mg | CHEWABLE_TABLET | ORAL | Status: DC | PRN

## 2023-06-05 MED ORDER — IBUPROFEN 600 MG PO TABS
600.0000 mg | ORAL_TABLET | Freq: Four times a day (QID) | ORAL | Status: DC
  Administered 2023-06-05 (×3): 600 mg via ORAL
  Filled 2023-06-05 (×5): qty 1

## 2023-06-05 MED ORDER — ONDANSETRON HCL 4 MG PO TABS: 4.0000 mg | ORAL_TABLET | ORAL | Status: DC | PRN

## 2023-06-05 MED ORDER — DIPHENHYDRAMINE HCL 25 MG PO CAPS: 25.0000 mg | ORAL_CAPSULE | Freq: Four times a day (QID) | ORAL | Status: DC | PRN

## 2023-06-05 MED ORDER — LACTATED RINGERS IV SOLN: INTRAVENOUS | Status: AC

## 2023-06-05 MED ORDER — LACTATED RINGERS IV SOLN: 500.0000 mL | Freq: Once | INTRAVENOUS | Status: DC

## 2023-06-05 MED ORDER — FENTANYL-BUPIVACAINE-NACL 0.5-0.125-0.9 MG/250ML-% EP SOLN
EPIDURAL | Status: AC
  Filled 2023-06-05: qty 250

## 2023-06-05 MED ORDER — MEASLES, MUMPS & RUBELLA VAC IJ SOLR
0.5000 mL | Freq: Once | INTRAMUSCULAR | Status: DC
  Filled 2023-06-05: qty 0.5

## 2023-06-05 MED ORDER — PHENYLEPHRINE 80 MCG/ML (10ML) SYRINGE FOR IV PUSH (FOR BLOOD PRESSURE SUPPORT): 80.0000 ug | PREFILLED_SYRINGE | INTRAVENOUS | Status: DC | PRN

## 2023-06-05 MED ORDER — METOCLOPRAMIDE HCL 10 MG PO TABS
10.0000 mg | ORAL_TABLET | Freq: Once | ORAL | Status: DC
  Filled 2023-06-05: qty 1

## 2023-06-05 MED ORDER — POLYETHYLENE GLYCOL 3350 17 G PO PACK: 17.0000 g | PACK | Freq: Every day | ORAL | Status: DC | PRN

## 2023-06-05 MED ORDER — TETANUS-DIPHTH-ACELL PERTUSSIS 5-2.5-18.5 LF-MCG/0.5 IM SUSY
0.5000 mL | PREFILLED_SYRINGE | Freq: Once | INTRAMUSCULAR | Status: DC
  Filled 2023-06-05: qty 0.5

## 2023-06-05 MED ORDER — COCONUT OIL OIL: 1.0000 | TOPICAL_OIL | Status: DC | PRN

## 2023-06-05 MED ORDER — FAMOTIDINE 20 MG PO TABS
40.0000 mg | ORAL_TABLET | Freq: Once | ORAL | Status: DC
  Filled 2023-06-05: qty 2

## 2023-06-05 MED ORDER — EPHEDRINE 5 MG/ML INJ: 10.0000 mg | INTRAVENOUS | Status: DC | PRN

## 2023-06-05 MED ORDER — DIBUCAINE (PERIANAL) 1 % EX OINT
1.0000 | TOPICAL_OINTMENT | CUTANEOUS | Status: DC | PRN
  Filled 2023-06-05: qty 28

## 2023-06-05 MED ORDER — FENTANYL-BUPIVACAINE-NACL 0.5-0.125-0.9 MG/250ML-% EP SOLN: 12.0000 mL/h | EPIDURAL | Status: DC | PRN

## 2023-06-05 MED ORDER — WITCH HAZEL-GLYCERIN EX PADS
1.0000 | MEDICATED_PAD | CUTANEOUS | Status: DC | PRN
  Filled 2023-06-05: qty 100

## 2023-06-05 MED ORDER — DIPHENHYDRAMINE HCL 50 MG/ML IJ SOLN: 12.5000 mg | INTRAMUSCULAR | Status: DC | PRN

## 2023-06-05 MED ORDER — ONDANSETRON HCL 4 MG/2ML IJ SOLN: 4.0000 mg | INTRAMUSCULAR | Status: DC | PRN

## 2023-06-05 MED ORDER — BENZOCAINE-MENTHOL 20-0.5 % EX AERO
1.0000 | INHALATION_SPRAY | CUTANEOUS | Status: DC | PRN
  Filled 2023-06-05: qty 56

## 2023-06-05 MED ORDER — PRENATAL MULTIVITAMIN CH
1.0000 | ORAL_TABLET | Freq: Every day | ORAL | Status: DC
  Administered 2023-06-05: 1 via ORAL
  Filled 2023-06-05: qty 1

## 2023-06-05 SURGICAL SUPPLY — 35 items
ADH SKN CLS APL DERMABOND .7 (GAUZE/BANDAGES/DRESSINGS) ×1
APL PRP STRL LF DISP 70% ISPRP (MISCELLANEOUS) ×1
BLADE SURG SZ11 CARB STEEL (BLADE) ×1 IMPLANT
CHLORAPREP W/TINT 26 (MISCELLANEOUS) ×1 IMPLANT
DERMABOND ADVANCED .7 DNX12 (GAUZE/BANDAGES/DRESSINGS) ×1 IMPLANT
DRAPE LAPAROTOMY 100X77 ABD (DRAPES) ×1 IMPLANT
ELECT REM PT RETURN 9FT ADLT (ELECTROSURGICAL) ×1
ELECTRODE REM PT RTRN 9FT ADLT (ELECTROSURGICAL) ×1 IMPLANT
GAUZE 4X4 16PLY ~~LOC~~+RFID DBL (SPONGE) ×1 IMPLANT
GLOVE BIO SURGEON STRL SZ7 (GLOVE) ×1 IMPLANT
GLOVE INDICATOR 7.5 STRL GRN (GLOVE) ×1 IMPLANT
GOWN STRL REUS W/ TWL LRG LVL3 (GOWN DISPOSABLE) ×1 IMPLANT
GOWN STRL REUS W/TWL LRG LVL3 (GOWN DISPOSABLE) ×1
KIT TURNOVER CYSTO (KITS) ×1 IMPLANT
LABEL OR SOLS (LABEL) ×1 IMPLANT
LIGASURE IMPACT 36 18CM CVD LR (INSTRUMENTS) IMPLANT
MANIFOLD NEPTUNE II (INSTRUMENTS) ×1 IMPLANT
NEEDLE HYPO 22X1.5 SAFETY MO (MISCELLANEOUS) ×1 IMPLANT
NS IRRIG 500ML POUR BTL (IV SOLUTION) ×1 IMPLANT
PACK BASIN MINOR ARMC (MISCELLANEOUS) ×1 IMPLANT
PAD OB MATERNITY 4.3X12.25 (PERSONAL CARE ITEMS) ×1 IMPLANT
RETRACTOR RING XSMALL (MISCELLANEOUS) ×1 IMPLANT
RTRCTR WOUND ALEXIS 13CM XS SH (MISCELLANEOUS) ×1
SCRUB CHG 4% DYNA-HEX 4OZ (MISCELLANEOUS) ×1 IMPLANT
SPONGE T-LAP 4X18 ~~LOC~~+RFID (SPONGE) ×1 IMPLANT
SUT CHROMIC GUT BROWN 0 54 (SUTURE) ×1 IMPLANT
SUT CHROMIC GUT BROWN 0 54IN (SUTURE) ×1
SUT MNCRL 4-0 (SUTURE) ×1
SUT MNCRL 4-0 27XMFL (SUTURE) ×1
SUT VIC AB 0 CT2 27 (SUTURE) ×1 IMPLANT
SUT VICRYL 0 UR6 27IN ABS (SUTURE) ×2 IMPLANT
SUTURE MNCRL 4-0 27XMF (SUTURE) ×1 IMPLANT
SYR 10ML LL (SYRINGE) ×1 IMPLANT
TRAP FLUID SMOKE EVACUATOR (MISCELLANEOUS) ×1 IMPLANT
WATER STERILE IRR 500ML POUR (IV SOLUTION) ×1 IMPLANT

## 2023-06-05 NOTE — Lactation Note (Signed)
This note was copied from a baby's chart. Lactation Consultation Note  Patient Name: Girl Nautica Hotz FIEPP'I Date: 06/05/2023 Age:28 hours Reason for consult: Initial assessment;Term   Maternal Data Does the patient have breastfeeding experience prior to this delivery?: Yes How long did the patient breastfeed?: 66yrs but supplemented w/ formula  Lactation to room for an initial assessment w/ an experienced breastfeeding patient and a 9hr old baby girl "Aurora".  This was a SVD.    Feeding goal is to breastfeed and possibly supplement w/ formula but right now mom states that she wants to just work on the breastfeeding.  She breastfed all her 4 kids; 1 child for 3 months and twins for 78yrs but supplemented with formula because she was going back to work.   Mom asked about how to get a breastpump through her insurance.  Feeding Mother's Current Feeding Choice: Breast Milk  Mom put infant to the left breast and infant latched with several audible swallows heard.  LC adjusted infants postioning at the breast, but overall an excellent feeding.  The feeding was short, 5 minutes, but infant was swallowing a lot.   Encouraged mom to burp infant and offer breast if she cues. Mom verbalized understanding.   LATCH Score Latch: Grasps breast easily, tongue down, lips flanged, rhythmical sucking.  Audible Swallowing: Spontaneous and intermittent  Type of Nipple: Everted at rest and after stimulation  Comfort (Breast/Nipple): Soft / non-tender  Hold (Positioning): No assistance needed to correctly position infant at breast.  LATCH Score: 10  Interventions Interventions: Breast feeding basics reviewed;Assisted with latch;Adjust position;Support pillows;Education  LC provided education on the following;  milk production expectations, hunger cues, day 1/2 wet/dirty diapers, hand expression, cluster feeding, benefits of STS and arousing infant for a feeding.  Lactation informed patient of  feeding infant at least 8 or more times w/in a 24hr period but not exceeding 3hrs. Patient verbalized understanding.     Discharge Pump: Advised to call insurance company (Provided [employer] number sheet for [employer]) WIC Program: No (Interested in signing up for this infant/her twins)  Consult Status Consult Status: Follow-up Follow-up type: In-patient    Yvette Rack Free 06/05/2023, 12:17 PM

## 2023-06-05 NOTE — Plan of Care (Signed)
  Problem: Education: Goal: Knowledge of Childbirth will improve Outcome: Completed/Met Goal: Ability to make informed decisions regarding treatment and plan of care will improve Outcome: Completed/Met Goal: Ability to state and carry out methods to decrease the pain will improve Outcome: Completed/Met   Problem: Coping: Goal: Ability to verbalize concerns and feelings about labor and delivery will improve Outcome: Completed/Met   Problem: Life Cycle: Goal: Ability to make normal progression through stages of labor will improve Outcome: Completed/Met Goal: Ability to effectively push during vaginal delivery will improve Outcome: Completed/Met   Problem: Role Relationship: Goal: Will demonstrate positive interactions with the child Outcome: Completed/Met   Problem: Safety: Goal: Risk of complications during labor and delivery will decrease Outcome: Completed/Met   Problem: Pain Management: Goal: Relief or control of pain from uterine contractions will improve Outcome: Completed/Met

## 2023-06-05 NOTE — Progress Notes (Signed)
Postpartum Day  0  Subjective: 27 y.o. [id number] postpartum day #0 status post normal spontaneous vaginal delivery. She is ambulating, is tolerating po, is voiding spontaneously.  Her pain is well controlled on PO pain medications. Her lochia is less than menses.  Objective: BP (!) 100/55 (BP Location: Left Arm)   Pulse 73   Temp 98.1 F (36.7 C) (Oral)   Resp 18   Ht 5\' 5"  (1.651 m)   Wt 113.9 kg   LMP 08/21/2022   SpO2 100%   Breastfeeding Unknown   BMI 41.77 kg/m    Physical Exam:  General: alert, cooperative, and no distress Breasts: soft/nontender Pulm: nl effort Abdomen: soft, non-tender, active bowel sounds Uterine Fundus: firm Perineum: minimal edema, laceration hemostatic Lochia: appropriate DVT Evaluation: No evidence of DVT seen on physical exam.  Recent Labs    06/04/23 1954 06/05/23 0558  HGB 12.0 10.7*  HCT 36.6 32.5*  WBC 7.7 14.1*  PLT 221 219    Assessment/Plan: 26 y.o. [id number] postpartum day # 0  1. Continue routine postpartum care  2. Infant feeding status: breast feeding -Lactation consult PRN for breastfeeding   3. Contraception plan: bilateral tubal ligation -Offered BTL today but does not desire to remain NPO  -Will reschedule BTL to tomorrow  4. Acute blood loss anemia - clinically significant.  -Hemodynamically stable and asymptomatic -Intervention: start on oral supplementation with ferrous sulfate 325 mg   5. Immunization status:   all immunizations up to date  Disposition: continue inpatient postpartum care    LOS: 1 day   Gustavo Lah, CNM 06/05/2023, 9:42 AM   ----- Alexis Clark  Certified Nurse Midwife [facility]

## 2023-06-06 DIAGNOSIS — O99213 Obesity complicating pregnancy, third trimester: Secondary | ICD-10-CM | POA: Diagnosis present

## 2023-06-06 MED ORDER — POLYETHYLENE GLYCOL 3350 17 G PO PACK: 17.0000 g | PACK | Freq: Every day | ORAL | 0 refills | Status: DC | PRN

## 2023-06-06 MED ORDER — BENZOCAINE-MENTHOL 20-0.5 % EX AERO: 1.0000 | INHALATION_SPRAY | CUTANEOUS | 0 refills | Status: DC | PRN

## 2023-06-06 MED ORDER — IBUPROFEN 600 MG PO TABS: 600.0000 mg | ORAL_TABLET | Freq: Four times a day (QID) | ORAL | 0 refills | Status: DC | PRN

## 2023-06-06 MED ORDER — WITCH HAZEL-GLYCERIN EX PADS: 1.0000 | MEDICATED_PAD | CUTANEOUS | 0 refills | Status: DC | PRN

## 2023-06-06 MED ORDER — PRENATAL MULTIVITAMIN CH: 1.0000 | ORAL_TABLET | Freq: Every day | ORAL | Status: AC

## 2023-06-06 MED ORDER — DIBUCAINE (PERIANAL) 1 % EX OINT: 1.0000 | TOPICAL_OINTMENT | CUTANEOUS | 0 refills | Status: DC | PRN

## 2023-06-06 MED ORDER — SIMETHICONE 80 MG PO CHEW: 80.0000 mg | CHEWABLE_TABLET | Freq: Four times a day (QID) | ORAL | 0 refills | Status: DC | PRN

## 2023-06-06 NOTE — Discharge Instructions (Signed)
Call office if you have any of the following:  -Persistent headache or visual changes (possible high blood pressure) -Fever >101.0 F or chills (possible infection) -Breast concerns (engorgement, mastitis) -Excessive vaginal bleeding (soaking through more than one pad in 1 hr x 2 hr) -Incision drainage/redness/increased pain/warmth at site (possible infection)  -Leg pain or redness (possible blood clot) -Depression/anxiety increased symptoms 2 weeks after delivery  Activity & Hygiene: -Do not lift > 10 lbs for 6 weeks.  -No intercourse or tampons for 6 weeks.  -No swimming pools, hot tubs or tub baths- showers only for 6 weeks **No driving for 1-2 weeks or while taking pain medication after c-section  -It is normal to bleed for up to 6 weeks. You should not soak through more than 1 pad in 1 hour x 2 hours.  Breastfeeding: -Continue prenatal vitamin.  -Increase calories and fluids.  -Your milk will come in, in the next couple of days (right now it is colostrum).  -You may have a slight fever when your milk comes in, but it should go away on its own.   -If it does not, and rises above 101 F please call the doctor.  -You will also feel achy and your breasts will be firm. They will also start to leak.  *For breastfeeding concerns, the lactation consultant can be reached at [phone]  Not Breastfeeding: -Avoid breast stimulation and wear supportive bra -ICE helps decrease inflammation and pain -Express milk for comfort by hand, do not empty breast  Postpartum blues: -feelings of happy one minute and sad another minute are normal for the first few weeks. -if it gets worse please let your doctor know. It is very common!!

## 2023-06-06 NOTE — Progress Notes (Signed)
Patient discharged home with family.  Discharge instructions, when to follow up, and medications reviewed with patient.  Patient verbalized understanding. Patient will be escorted out by staff.

## 2023-06-06 NOTE — Discharge Summary (Signed)
Postpartum Discharge Summary  Patient Name: Alexis Clark DOB: 04-24-1996 MRN: [id number]  Date of admission: 06/04/2023 Delivery date:06/05/2023 Delivering provider: Romana Juniper Date of discharge: 06/06/2023  Primary OB: [facility] [id number] last menstrual period was 08/21/2022. EDC Estimated Date of Delivery: 06/02/23 Gestational Age at Delivery: [redacted]w[redacted]d   Admitting diagnosis: Normal labor [O80, Z37.9] Intrauterine pregnancy: [redacted]w[redacted]d     Secondary diagnosis:   Principal Problem:   Normal labor Active Problems:   Obesity affecting pregnancy in third trimester   Discharge Diagnosis: Term Pregnancy Delivered                                                Post partum procedures: none Augmentation: AROM Complications: None Delivery Type: spontaneous vaginal delivery Anesthesia: non-pharmacological methods Placenta: spontaneous To Pathology: No  Laceration: labial, not repaired Episiotomy: none  Prenatal Labs:  Blood type/Rh O POS   Antibody screen Negative    Rubella Immune    Varicella Immune  RPR Neg    HBsAg Neg   Hep C NR   HIV Neg    GC neg  Chlamydia neg  Genetic screening cfDNA negative   1 hour GTT 97  3 hour GTT N/a  GBS Neg    Hospital course: Onset of Labor With Vaginal Delivery      27 y.o. yo [id number] at [redacted]w[redacted]d was admitted in Active Labor on 06/04/2023. Labor course was uncomplicated. Membrane Rupture Time/Date: 10:01 PM,06/04/2023  Delivery Method:Vaginal, Spontaneous Operative Delivery:N/A Episiotomy: None Lacerations:  Labial Patient had a postpartum course without complication.  She is ambulating, tolerating a regular diet, passing flatus, and urinating well. Patient is discharged home in stable condition on 06/06/23.  Newborn Data: Birth date:06/05/2023 Birth time:1:51 AM Gender:Female Living status:Living Apgars:9 ,10  Weight:3500 g  Magnesium Sulfate received: No BMZ received: No Rhophylac:No MMR:No Varivax  vaccine given: was not indicated T-DaP:Given prenatally Flu: No  Transfusion:No  Physical exam  Vitals:   06/05/23 1608 06/05/23 1940 06/05/23 2336 06/06/23 0841  BP: 106/65 103/64 (!) 116/59 111/69  Pulse: 73 79 72 75  Resp:  17 18 20   Temp: 98.7 F (37.1 C) 98.8 F (37.1 C) 98.5 F (36.9 C) 97.9 F (36.6 C)  TempSrc: Oral Oral Oral Oral  SpO2: 100% 100% 100% 100%  Weight:      Height:       General: alert, cooperative, and no distress Lochia: appropriate Uterine Fundus: firm Perineum: minimal edema/laceration hemostatic DVT Evaluation: No evidence of DVT seen on physical exam.  Labs: Lab Results  Component Value Date   WBC 14.1 (H) 06/05/2023   HGB 10.7 (L) 06/05/2023   HCT 32.5 (L) 06/05/2023   MCV 77.6 (L) 06/05/2023   PLT 219 06/05/2023      Latest Ref Rng & Units 02/12/2020    4:25 PM  CMP  Glucose 70 - 99 mg/dL 846   BUN 6 - 20 mg/dL 7   Creatinine 9.62 - 9.52 mg/dL 8.41   Sodium 324 - 401 mmol/L 135   Potassium 3.5 - 5.1 mmol/L 3.3   Chloride 98 - 111 mmol/L 105   CO2 22 - 32 mmol/L 22   Calcium 8.9 - 10.3 mg/dL 9.2   Total Protein 6.5 - 8.1 g/dL 7.4   Total Bilirubin 0.3 - 1.2 mg/dL 0.5   Alkaline Phos 38 - 126 U/L  48   AST 15 - 41 U/L 18   ALT 0 - 44 U/L 17    Edinburgh Score:    06/05/2023    7:00 AM  Edinburgh Postnatal Depression Scale Screening Tool  I have been able to laugh and see the funny side of things. 0  I have looked forward with enjoyment to things. 0  I have blamed myself unnecessarily when things went wrong. 0  I have been anxious or worried for no good reason. 0  I have felt scared or panicky for no good reason. 0  Things have been getting on top of me. 1  I have been so unhappy that I have had difficulty sleeping. 0  I have felt sad or miserable. 0  I have been so unhappy that I have been crying. 0  The thought of harming myself has occurred to me. 0  Edinburgh Postnatal Depression Scale Total 1    Risk assessment for  postpartum VTE and prophylactic treatment: Very high risk factors: None High risk factors: BMI 40-50 kg/m2 Moderate risk factors: None  Postpartum VTE prophylaxis with LMWH not indicated  After visit meds:  Allergies as of 06/06/2023   No Known Allergies      Medication List     STOP taking these medications    multivitamin with minerals tablet       TAKE these medications    acetaminophen 500 MG tablet Commonly known as: TYLENOL Take 1,000 mg by mouth every 6 (six) hours as needed for mild pain.   benzocaine-Menthol 20-0.5 % Aero Commonly known as: DERMOPLAST Apply 1 Application topically as needed for irritation (perineal discomfort).   dibucaine 1 % Oint Commonly known as: NUPERCAINAL Place 1 Application rectally as needed for hemorrhoids.   ibuprofen 600 MG tablet Commonly known as: ADVIL Take 1 tablet (600 mg total) by mouth every 6 (six) hours as needed for headache, mild pain or cramping.   polyethylene glycol 17 g packet Commonly known as: MIRALAX / GLYCOLAX Take 17 g by mouth daily as needed for mild constipation.   prenatal multivitamin Tabs tablet Take 1 tablet by mouth daily at 12 noon.   simethicone 80 MG chewable tablet Commonly known as: MYLICON Chew 1 tablet (80 mg total) by mouth 4 (four) times daily as needed for flatulence.   witch hazel-glycerin pad Commonly known as: TUCKS Apply 1 Application topically as needed for hemorrhoids.       Discharge home in stable condition Infant Feeding: Breast Infant Disposition:home with mother Discharge instruction: per After Visit Summary and Postpartum booklet. Activity: Advance as tolerated. Pelvic rest for 6 weeks.  Diet: routine diet Anticipated Birth Control: Plans Interval BTL Postpartum Appointment: 6 weeks Additional Postpartum F/U:  interval BTL Follow up Visit:  Follow-up Information     Schermerhorn, Demetra V, MD. Schedule an appointment as soon as possible for a visit in 6  week(s).   Specialty: Obstetrics and Gynecology Why: postpartum visit Contact information: [address] [place] [address] [phone]         Schermerhorn, Festus Holts, MD. Schedule an appointment as soon as possible for a visit in 3 week(s).   Specialty: Obstetrics and Gynecology Why: to discuss postpartum tubal Contact information: [address] [place] [address] [phone]                 Plan:  Alexis Clark was discharged to home in good condition. Follow-up appointment as directed.    Signed:  Janyce Llanos, CNM 06/06/2023 9:27 AM

## 2023-07-01 ENCOUNTER — Other Ambulatory Visit: Payer: Self-pay | Admitting: Obstetrics and Gynecology

## 2023-07-01 DIAGNOSIS — Z01818 Encounter for other preprocedural examination: Secondary | ICD-10-CM

## 2023-07-01 NOTE — Progress Notes (Unsigned)
GYN Pre-op Visit   CC: pre-op   HPI: Alexis Clark 27 y.o. [id number] with a hx of obesity s/p SVD on 06/04/23 who presents for a pre-op visit for sterilization.   Patient desires sterilization. She has 4 children and continues to be certain she is done with childbearing. PMH notable for obesity. History of partial left salpingectomy for non-ruptured ectopic pregnancy. Pt signed medicaid consent July 2024.    ROS: All other systems reviewed and negative   PMHx: Past Medical History:  Diagnosis Date   Asthma without status asthmaticus (HHS-HCC)    when younger   History of chlamydia       PSHx: Past Surgical History:  Procedure Laterality Date   OPEN REDUCTION POSTERIOR PELVIC RING FRACTURE &/OR DISLOCATION Left 03/13/2015   Procedure: OPEN REDUCTION POSTERIOR PELVIC RING FRACTURE &/OR DISLOCATION;  Surgeon: Kathrynn Ducking, MD;  Location: [facility];  Service: Orthopedics;  Laterality: Left;   LAPAROSCOPIC REMOVAL ECTOPIC PREGNANCY Left    Partial left salpingectomy   TONSILLECTOMY        OBHx: OB History  Gravida Para Term Preterm AB Living  4 3 3   1 4   SAB IAB Ectopic Molar Multiple Live Births      1   1 4     # Outcome Date GA Lbr Len/2nd Weight Sex Type Anes PTL Lv  4 Term 06/05/23 [redacted]w[redacted]d / 00:03 3.5 kg (7 lb 11.5 oz) F Vag-Spont None  LIV  3A Term 09/14/20 [redacted]w[redacted]d  2.835 kg (6 lb 4 oz) F Vag-Spont   LIV  3B Term 09/14/20 [redacted]w[redacted]d  1.899 kg (4 lb 3 oz) F Vag-Spont   LIV  2 Term 02/09/13 [redacted]w[redacted]d  3.657 kg (8 lb 1 oz) F Vag-Spont  N LIV  1 Ectopic 2014 [redacted]w[redacted]d            GYN Hx: - LMP: Patient's last menstrual period was 08/21/2022 (exact date).  - Menarche: Age *** - Menses: Reports bleeding for ***days every *** days (regular*** intervals), *** associated cramping.  - Menopause: NA *** - Pap hx: Denies any history of abnormals***, last ***. Never had any excisional procedures*** - STI hx: Denies any history of gonorrhea, chlamydia, herpes*** - Sexual preference: Sexually  active*** - Contraceptive method: *** - Fertility desires: *** - Abuse hx: ***   Meds: Current Outpatient Medications on File Prior to Visit  Medication Sig Dispense Refill   acetaminophen (TYLENOL) 325 MG tablet Take 650 mg by mouth every 4 (four) hours as needed for Pain Reported on 11/14/2015 (Patient not taking: Reported on 07/01/2023)     acetaminophen (TYLENOL) 500 MG tablet Take 1,000 mg by mouth every 6 (six) hours as needed (Patient not taking: Reported on 07/01/2023)     prenatal vit-iron fum-folic ac (PRENAVITE) tablet Take 1 tablet by mouth once daily (Patient not taking: Reported on 07/01/2023)     No current facility-administered medications on file prior to visit.     Allergies: No Known Allergies   SocHx: Social History   Tobacco Use   Smoking status: Never   Smokeless tobacco: Never  Vaping Use   Vaping status: Never Used  Substance Use Topics   Alcohol use: No   Drug use: No     OBJECTIVE: BP 115/75   Ht 165.1 cm (5\' 5" )   Wt (!) 107 kg (236 lb)   LMP 08/21/2022 (Exact Date)   Breastfeeding Yes   BMI 39.27 kg/m   General: Alert, NAD HEENT: MMM.  CV: Regular rate Resp: No increased work of breathing Abd: Nontender to palpation  Pelvic exam: Alexis Clark, CMA was present as chaperone) Normal external female genitalia without lesions;  Normal bartholins/skenes glands, normal urethral meatus;  Vaginal mucosa pink and moist with normal rugae, brown blood in the vault  Cervix is normal without lesions, multiparous in appearance, pap collected  Chaperone present for pelvic exam.    ASSESSMENT/PLAN: Alexis Clark is a 27 y.o. [id number] who presents for sterilization consult and pre-op visit.    #Desires sterilization - Insurance: Medicaid. Pt signed medicaid consent July 2024  - Now has 4 children and continues to be certain she is done with childbearing.  - BMI Body mass index is 39.27 kg/m. - Abdominal surgeries: left partial salpingectomy for  ectopic pregnancy - Risks, benefits, and alternatives of the procedure were discussed with the patient. These include, but are not limited to, bleeding, infection, and damage to adjacent organs, such as bowel or bladder, risk of failure or ectopic pregnancy requiring other surgery. We discussed different forms of tubal ligation including bilateral salpingectomy, filshie clips, and cautery. She desires to proceed with salpingectomy. We discussed that this is intended to be a permanent procedure, not reversible. Patient wants to proceed. Surgical consent signed today. - Patient elects for the following BTL method: laparoscopic bilateral salpingectomy (discussed removal of whatever portion is left of her left tube)  #Pre-op - Case request submitted for laparoscopic bilateral salpingectomy. - Discussed indication, benefits, and risks such as infection, bleeding, organ damage (including damage to ureters, bowel, and bladder), anesthesia complications, and need for possible blood products were discussed; patient will accept products in case of an emergency. We discussed the risks of a blood transfusion, such as a 1 in 1.2-1.4 million chance of contracting HIV, Hep C. All questions answers. Surgical and blood consents signed. - Pre-op orders placed. No antibiotics indicated.  - Discussed post-op lifting restrictions  - Post-op prescriptions (scheduled tylenol q8h, ibuprofen q8h, oxycodone q6h PRN x 12 tabs, zofran PRN) sent  Romana Juniper, MD 07/01/2023 3:01 PM

## 2023-07-23 NOTE — H&P (Signed)
GYN Pre-op H&P   CC: pre-op   HPI: Alexis Clark 27 y.o. [id number] with a hx of obesity who presents to clinic for a pre-op visit for laparoscopic bilateral salpingectomy. Scheduled on 08/08/23.   Patient desires sterilization. She has 4 children and continues to be certain she is done with childbearing. Hx of L partial salpingectomy for ectopic pregnancy. Pt signed medicaid consent July 2024.    ROS: All other systems reviewed and negative   PMHx: Past Medical History:  Diagnosis Date   Asthma without status asthmaticus (HHS-HCC)    when younger   History of chlamydia       PSHx: Past Surgical History:  Procedure Laterality Date   OPEN REDUCTION POSTERIOR PELVIC RING FRACTURE &/OR DISLOCATION Left 03/13/2015   Procedure: OPEN REDUCTION POSTERIOR PELVIC RING FRACTURE &/OR DISLOCATION;  Surgeon: Kathrynn Ducking, MD;  Location: [facility];  Service: Orthopedics;  Laterality: Left;   LAPAROSCOPIC REMOVAL ECTOPIC PREGNANCY Left    Partial left salpingectomy   TONSILLECTOMY       OBHx: OB History  Gravida Para Term Preterm AB Living  4 3 3   1 4   SAB IAB Ectopic Molar Multiple Live Births      1   1 4     # Outcome Date GA Lbr Len/2nd Weight Sex Type Anes PTL Lv  4 Term 06/05/23 [redacted]w[redacted]d / 00:03 3.5 kg (7 lb 11.5 oz) F Vag-Spont None  LIV  3A Term 09/14/20 [redacted]w[redacted]d  2.835 kg (6 lb 4 oz) F Vag-Spont   LIV  3B Term 09/14/20 [redacted]w[redacted]d  1.899 kg (4 lb 3 oz) F Vag-Spont   LIV  2 Term 02/09/13 [redacted]w[redacted]d  3.657 kg (8 lb 1 oz) F Vag-Spont  N LIV  1 Ectopic 2014 [redacted]w[redacted]d             Meds: Current Outpatient Medications on File Prior to Visit  Medication Sig Dispense Refill   acetaminophen (TYLENOL) 325 MG tablet Take 650 mg by mouth every 4 (four) hours as needed for Pain Reported on 11/14/2015 (Patient not taking: Reported on 07/23/2023)     acetaminophen (TYLENOL) 500 MG tablet Take 1,000 mg by mouth every 6 (six) hours as needed (Patient not taking: Reported on 04/07/2023)     prenatal vit-iron  fum-folic ac (PRENAVITE) tablet Take 1 tablet by mouth once daily (Patient not taking: Reported on 07/23/2023)     No current facility-administered medications on file prior to visit.     Allergies: No Known Allergies   SocHx: Social History   Tobacco Use   Smoking status: Never   Smokeless tobacco: Never  Vaping Use   Vaping status: Never Used  Substance Use Topics   Alcohol use: No   Drug use: No     OBJECTIVE: BP 108/64   Pulse 73   Ht 165.1 cm (5\' 5" )   Wt (!) 106.7 kg (235 lb 3.2 oz)   Breastfeeding Yes   BMI 39.14 kg/m    Gen: NAD HEENT: [place]/AT CV: RRR PULM: CTAB, normal work of breathing ABD: soft, nontender, nondistended EXT: No BLE edema     ASSESSMENT/PLAN: Alexis Clark 27 y.o. [id number] with a hx of obesity who presents for a pre-op visit for laparoscopic bilateral salpingectomy. Scheduled on 08/08/23.   #Desire for sterilization - Insurance: Medicaid. Pt signed medicaid consent July 2024.  - Now has 4 children and continues to be certain she is done with childbearing.  - Body mass index is 39.14 kg/m.  -  PSH: Hx of left partial salpingectomy. - Discussed alternative methods of contraception.  - Desires bilateral salpingectomy  #Pre-op - Scheduled for a laparoscopic bilateral salpingectomy for sterilization - Discussed indication, benefits, and risks such as infection, bleeding, organ damage (including damage to ureters, bowel, and bladder), anesthesia complications, need to convert to laparotomy, and need for possible blood products were discussed; patient will accept products in case of an emergency. We discussed the risks of a blood transfusion, such as a 1 in 1.2-1.4 million chance of contracting HIV, Hep C. All questions answers. Surgical and blood consents signed. - Pre-op orders previously placed. No antibiotics indicated.  - Discussed post-op lifting restrictions  - Post-op prescriptions (scheduled tylenol q8h, ibuprofen q8h, oxycodone q6h PRN x  12 tabs, zofran PRN) previously sent  Alexis Fuston Roda Shutters, MD

## 2023-07-31 ENCOUNTER — Encounter
Admission: RE | Admit: 2023-07-31 | Discharge: 2023-07-31 | Disposition: A | Discharge status: Home / Self Care | Payer: MEDICAID | Source: Ambulatory Visit | Attending: Obstetrics and Gynecology | Admitting: Obstetrics and Gynecology

## 2023-07-31 ENCOUNTER — Other Ambulatory Visit: Payer: Self-pay

## 2023-07-31 DIAGNOSIS — Z01812 Encounter for preprocedural laboratory examination: Secondary | ICD-10-CM

## 2023-07-31 NOTE — Patient Instructions (Addendum)
Your procedure is scheduled on: 08/08/23 - Friday Report to the Registration Desk on the 1st floor of the [facility]. To find out your arrival time, please call [phone] between 1PM - 3PM on: 08/07/23 - Thursday If your arrival time is 6:00 am, do not arrive before that time as the [facility] entrance doors do not open until 6:00 am.  REMEMBER: Instructions that are not followed completely may result in serious medical risk, up to and including death; or upon the discretion of your surgeon and anesthesiologist your surgery may need to be rescheduled.  Do not eat food after midnight the night before surgery.  No gum chewing or hard candies.  You may however, drink CLEAR liquids up to 2 hours before you are scheduled to arrive for your surgery. Do not drink anything within 2 hours of your scheduled arrival time.  Clear liquids include: - water  - apple juice without pulp - gatorade (not RED colors) - black coffee or tea (Do NOT add milk or creamers to the coffee or tea) Do NOT drink anything that is not on this list.  In addition, your doctor has ordered for you to drink the provided:  Ensure Pre-Surgery Clear Carbohydrate Drink  Drinking this carbohydrate drink up to two hours before surgery helps to reduce insulin resistance and improve patient outcomes. Please complete drinking 2 hours before scheduled arrival time.  One week prior to surgery: Stop Anti-inflammatories (NSAIDS) such as Advil, Aleve, Ibuprofen, Motrin, Naproxen, Naprosyn and Aspirin based products such as Excedrin, Goody's Powder, BC Powder. You may take Tylenol if needed for pain up until the day of surgery.  Stop ANY OVER THE COUNTER supplements until after surgery.  ON THE DAY OF SURGERY ONLY TAKE THESE MEDICATIONS WITH SIPS OF WATER:  none   No Alcohol for 24 hours before or after surgery.  No Smoking including e-cigarettes for 24 hours before surgery.  No chewable tobacco products for at least 6  hours before surgery.  No nicotine patches on the day of surgery.  Do not use any "recreational" drugs for at least a week (preferably 2 weeks) before your surgery.  Please be advised that the combination of cocaine and anesthesia may have negative outcomes, up to and including death. If you test positive for cocaine, your surgery will be cancelled.  On the morning of surgery brush your teeth with toothpaste and water, you may rinse your mouth with mouthwash if you wish. Do not swallow any toothpaste or mouthwash.  Use CHG Soap or wipes as directed on instruction sheet.  Do not wear jewelry, make-up, hairpins, clips or nail polish.  For welded (permanent) jewelry: bracelets, anklets, waist bands, etc.  Please have this removed prior to surgery.  If it is not removed, there is a chance that hospital personnel will need to cut it off on the day of surgery.  Do not wear lotions, powders, or perfumes.   Do not shave body hair from the neck down 48 hours before surgery.  Contact lenses, hearing aids and dentures may not be worn into surgery.  Do not bring valuables to the hospital. [facility] is not responsible for any missing/lost belongings or valuables.   Notify your doctor if there is any change in your medical condition (cold, fever, infection).  Wear comfortable clothing (specific to your surgery type) to the hospital.  After surgery, you can help prevent lung complications by doing breathing exercises.  Take deep breaths and cough every 1-2 hours.  Your doctor may order a device called an Incentive Spirometer to help you take deep breaths. When coughing or sneezing, hold a pillow firmly against your incision with both hands. This is called "splinting." Doing this helps protect your incision. It also decreases belly discomfort.  If you are being admitted to the hospital overnight, leave your suitcase in the car. After surgery it may be brought to your room.  In case of increased  patient census, it may be necessary for you, the patient, to continue your postoperative care in the Same Day Surgery department.  If you are being discharged the day of surgery, you will not be allowed to drive home. You will need a responsible individual to drive you home and stay with you for 24 hours after surgery.   If you are taking public transportation, you will need to have a responsible individual with you.  Please call the Pre-admissions Testing Dept. at [phone] if you have any questions about these instructions.  Surgery Visitation Policy:  Patients having surgery or a procedure may have two visitors.  Children under the age of 40 must have an adult with them who is not the patient.  Inpatient Visitation:    Visiting hours are 7 a.m. to 8 p.m. Up to four visitors are allowed at one time in a patient room. The visitors may rotate out with other people during the day.  One visitor age 28 or older may stay with the patient overnight and must be in the room by 8 p.m. Preparing the Skin Before Surgery     To help prevent the risk of infection at your surgical site, we are now providing you with rinse-free Sage 2% Chlorhexidine Gluconate (CHG) disposable wipes.  Chlorhexidine Gluconate (CHG) Soap  o An antiseptic cleaner that kills germs and bonds with the skin to continue killing germs even after washing  o Used for showering the night before surgery and morning of surgery  The night before surgery: Shower or bathe with warm water. Do not apply perfume, lotions, powders. Wait one hour after shower. Skin should be dry and cool. Open Sage wipe package - use 6 disposable cloths. Wipe body using one cloth for the right arm, one cloth for the left arm, one cloth for the right leg, one cloth for the left leg, one cloth for the chest/abdomen area, and one cloth for the back. Do not use on open wounds or sores. Do not use on face or genitals (private parts). If you are  breast feeding, do not use on breasts. 5. Do not rinse, allow to dry. 6. Skin may feel "tacky" for several minutes. 7. Dress in clean clothes. 8. Place clean sheets on your bed and do not sleep with pets.  REPEAT ABOVE ON THE MORNING OF SURGERY BEFORE ARRIVING TO THE HOSPITAL.

## 2023-08-01 ENCOUNTER — Inpatient Hospital Stay
Admission: RE | Admit: 2023-08-01 | Discharge: 2023-08-01 | Disposition: A | Discharge status: Home / Self Care | Payer: Medicaid Other | Source: Ambulatory Visit

## 2023-08-01 NOTE — Progress Notes (Signed)
Call to patient due to missing her lab appointment today in pre-admit testing dept. Patient stated that she decided not to have the surgery and was going to call Dr. Francesca Oman office to let them know. Call placed to Dr. Francesca Oman office, message left for Angie T. Informing of the above.

## 2023-08-08 ENCOUNTER — Ambulatory Visit: Admit: 2023-08-08 | Payer: Medicaid Other | Admitting: Obstetrics and Gynecology

## 2023-08-08 SURGERY — SALPINGECTOMY, BILATERAL, LAPAROSCOPIC
Anesthesia: Choice | Laterality: Bilateral

## 2024-07-11 ENCOUNTER — Ambulatory Visit
Admission: EM | Admit: 2024-07-11 | Discharge: 2024-07-11 | Disposition: A | Discharge status: Home / Self Care | Attending: Emergency Medicine | Admitting: Emergency Medicine

## 2024-07-11 DIAGNOSIS — B349 Viral infection, unspecified: Secondary | ICD-10-CM | POA: Diagnosis not present

## 2024-07-11 DIAGNOSIS — R0602 Shortness of breath: Secondary | ICD-10-CM | POA: Diagnosis not present

## 2024-07-11 DIAGNOSIS — Z8709 Personal history of other diseases of the respiratory system: Secondary | ICD-10-CM | POA: Diagnosis not present

## 2024-07-11 LAB — POCT RAPID STREP A (OFFICE): Rapid Strep A Screen: NEGATIVE

## 2024-07-11 MED ORDER — PREDNISONE 10 MG PO TABS: 40.0000 mg | ORAL_TABLET | Freq: Every day | ORAL | 0 refills | Status: AC

## 2024-07-11 MED ORDER — ALBUTEROL SULFATE HFA 108 (90 BASE) MCG/ACT IN AERS
1.0000 | INHALATION_SPRAY | Freq: Four times a day (QID) | RESPIRATORY_TRACT | 0 refills | Status: AC | PRN
Start: 2024-07-11 — End: ?

## 2024-07-11 NOTE — ED Provider Notes (Signed)
 [facility]    CSN: [id number] Arrival date & time: 07/11/24  0857      History   Chief Complaint Chief Complaint  Patient presents with   Sore Throat    HPI Alexis Clark is a 28 y.o. female.  Patient presents with 1 day history of sore throat, shortness of breath, diarrhea.  She describes the shortness of breath as feeling tight when she tries to take a deep breath.  She has history of asthma as a young child but none since approximately age 42.  She took 1 dose of Mucinex yesterday; none taken today.  Patient reports 3 loose stools yesterday; none today.  No fever, chest pain, abdominal pain, vomiting.  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  The history is provided by the patient and medical records.    Past Medical History:  Diagnosis Date   Anemia    Asthma    GERD (gastroesophageal reflux disease)    OCC-NO MEDS   History of asthma    Thyroid enlargement    Twin pregnancy    Delivered 09/14/2020    Patient Active Problem List   Diagnosis Date Noted   Obesity affecting pregnancy in third trimester 06/06/2023   Normal labor 06/04/2023   Encounter for supervision of normal pregnancy in third trimester 10/22/2022   Lymphadenopathy x 11 mo 11/15/2021   Irregular menstrual cycle 06/29/2018   Obesity, unspecified BMI=35.5 11/03/2017   Pilonidal cyst without abscess    Pilonidal cyst with abscess 09/26/2017   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/23/2015   Enlargement of thyroid 11/08/2014    Past Surgical History:  Procedure Laterality Date   BONY PELVIS SURGERY     AGE 62   ECTOPIC PREGNANCY SURGERY     REMOVED FALLOPIAN TUBE   HIP FRACTURE SURGERY Left    PILONIDAL CYST EXCISION N/A 10/07/2017   Procedure: CYST EXCISION PILONIDAL EXTENSIVE;  Surgeon: Desiderio Schanz, MD;  Location: [facility] ORS;  Service: General;  Laterality: N/A;   SALPINGECTOMY Left    TONSILLECTOMY     at age 59   WISDOM TOOTH EXTRACTION      OB History      Gravida  4   Para  3   Term  3   Preterm  0   AB  1   Living  4      SAB  0   IAB  0   Ectopic  1   Multiple  1   Live Births  4            Home Medications    Prior to Admission medications   Medication Sig Start Date End Date Taking? Authorizing Provider  albuterol (VENTOLIN HFA) 108 (90 Base) MCG/ACT inhaler Inhale 1-2 puffs into the lungs every 6 (six) hours as needed. 07/11/24  Yes Corlis Burnard DEL, NP  predniSONE (DELTASONE) 10 MG tablet Take 4 tablets (40 mg total) by mouth daily for 3 days. 07/11/24 07/14/24 Yes Corlis Burnard DEL, NP  Prenatal Vit-Fe Fumarate-FA (PRENATAL MULTIVITAMIN) TABS tablet Take 1 tablet by mouth daily at 12 noon. Patient taking differently: Take 1 tablet by mouth once a week. 06/06/23   Tanda Edsel Fuller, CNM    Family History Family History  Problem Relation Age of Onset   Diabetes Maternal Grandmother    Diabetes Maternal Grandfather    Hyperlipidemia Father    Asthma Mother    Heart disease Mother    Breast cancer Paternal  Aunt    Down syndrome Half-Brother    Multiple births Daughter    Ovarian cancer Neg Hx    Colon cancer Neg Hx     Social History Social History   Tobacco Use   Smoking status: Former    Types: Cigarettes    Passive exposure: Past   Smokeless tobacco: Never  Vaping Use   Vaping status: Never Used  Substance Use Topics   Alcohol use: Not Currently    Alcohol/week: 3.0 standard drinks of alcohol    Types: 3 Cans of beer per week    Comment: last use 11/10/21 qo weekend   Drug use: Not Currently    Types: Marijuana    Comment: last use 5 years ago     Allergies   Patient has no known allergies.   Review of Systems Review of Systems  Constitutional:  Negative for chills and fever.  HENT:  Positive for sore throat. Negative for congestion and ear pain.   Respiratory:  Positive for shortness of breath. Negative for cough.   Gastrointestinal:  Positive for diarrhea. Negative for abdominal  pain and vomiting.     Physical Exam Triage Vital Signs ED Triage Vitals  Encounter Vitals Group     BP 07/11/24 0907 118/77     Girls Systolic BP Percentile --      Girls Diastolic BP Percentile --      Boys Systolic BP Percentile --      Boys Diastolic BP Percentile --      Pulse Rate 07/11/24 0907 72     Resp 07/11/24 0907 18     Temp 07/11/24 0907 97.9 F (36.6 C)     Temp src --      SpO2 07/11/24 0907 98 %     Weight --      Height --      Head Circumference --      Peak Flow --      Pain Score 07/11/24 0924 5     Pain Loc --      Pain Education --      Exclude from Growth Chart --    No data found.  Updated Vital Signs BP 118/77   Pulse 72   Temp 97.9 F (36.6 C)   Resp 18   SpO2 98%   Visual Acuity Right Eye Distance:   Left Eye Distance:   Bilateral Distance:    Right Eye Near:   Left Eye Near:    Bilateral Near:     Physical Exam Constitutional:      General: She is not in acute distress.    Appearance: She is obese.  HENT:     Right Ear: Tympanic membrane normal.     Left Ear: Tympanic membrane normal.     Nose: Nose normal.     Mouth/Throat:     Mouth: Mucous membranes are moist.     Pharynx: Oropharynx is clear.  Cardiovascular:     Rate and Rhythm: Normal rate and regular rhythm.     Heart sounds: Normal heart sounds.  Pulmonary:     Effort: Pulmonary effort is normal. No respiratory distress.     Breath sounds: Normal breath sounds.  Abdominal:     General: Bowel sounds are normal.     Palpations: Abdomen is soft.     Tenderness: There is no abdominal tenderness. There is no guarding or rebound.  Neurological:     Mental Status: She is alert.  UC Treatments / Results  Labs (all labs ordered are listed, but only abnormal results are displayed) Labs Reviewed  POCT RAPID STREP A (OFFICE)    EKG   Radiology No results found.  Procedures Procedures (including critical care time)  Medications Ordered in  UC Medications - No data to display  Initial Impression / Assessment and Plan / UC Course  I have reviewed the triage vital signs and the nursing notes.  Pertinent labs & imaging results that were available during my care of the patient were reviewed by me and considered in my medical decision making (see chart for details).    Viral illness, shortness of breath, history of asthma, lactating mother.  Afebrile and vital signs are stable.  Lungs are clear and O2 sat is 98% on room air.  Rapid strep negative.  Patient has history of asthma although she has not had symptoms since she was a young child.  Treating today with albuterol inhaler and 3-day course of prednisone.  Education provided on shortness of breath and viral illness.  Instructed patient to follow-up with her PCP tomorrow.  ED precautions given.  She agrees to plan of care.  Final Clinical Impressions(s) / UC Diagnoses   Final diagnoses:  Viral illness  Shortness of breath  History of asthma  Lactating mother     Discharge Instructions      Take the prednisone and use the albuterol inhaler as directed.    Follow-up with your primary care provider tomorrow.  Go to the emergency department if you have worsening symptoms.    Strep test is negative.     ED Prescriptions     Medication Sig Dispense Auth. Provider   albuterol (VENTOLIN HFA) 108 (90 Base) MCG/ACT inhaler Inhale 1-2 puffs into the lungs every 6 (six) hours as needed. 18 g Corlis Burnard DEL, NP   predniSONE (DELTASONE) 10 MG tablet Take 4 tablets (40 mg total) by mouth daily for 3 days. 12 tablet Corlis Burnard DEL, NP      PDMP not reviewed this encounter.   Corlis Burnard DEL, NP 07/11/24 [phone]

## 2024-07-11 NOTE — ED Triage Notes (Signed)
 Patient to Urgent Care with complaints of throat pain and tightness.Waking up during the night gasping for air.   Reports symptoms started yesterday.   Taking mucinex with good results.

## 2024-07-11 NOTE — Discharge Instructions (Addendum)
 Take the prednisone and use the albuterol inhaler as directed.    Follow-up with your primary care provider tomorrow.  Go to the emergency department if you have worsening symptoms.    Strep test is negative.

## 2024-08-18 NOTE — Progress Notes (Signed)
 Obstetrics & Gynecology Office Visit   IUD INSERTION VISIT  Alexis Clark is a 28 y.o. female who presents for insertion of an intra-uterine device known as Paragard.  Cytotec /ibuprofen  prep: did not receive  Indications: contraception  Gonorrhea/Chlamydia cultures were collected Urine pregnancy test: negative Last pap:  Diagnostic Interpretation  Date Value Ref Range Status  07/01/2023 Comment  Final    Comment:    NEGATIVE FOR INTRAEPITHELIAL LESION OR MALIGNANCY.     No LMP recorded.  OBJECTIVE: Vitals:   08/18/24 1044  BP: 100/67  Pulse: 60   Body mass index is 40.3 kg/m.  Chaperone present for pelvic exam. Examination chaperoned by N. Saligan, CMA.  Risks, benefits, and alternatives of the procedure and the IUD were discussed with the patient and informed consent was obtained verbally and in writing.  She understood risks of IUD placement to include bleeding, infection, uterine perforation, risk of expulsion, risk of failure < 1%, increased risk of ectopic pregnancy in the event of failure.  Time out protocol was followed to verify the identity of the patient and the correct procedure being performed with the patients participation and agreement.  After a complete bimanual examination of the pelvic organs, the uterus was noted to be normal size.  With the patient in the dorsal lithotomy position, a speculum was placed in the vagina and the cervix was visualized. GC/CT culture was obtained.The cervix was cleaned with Betadine. Topical hurricaine spray was used for topical anesthetic.   The patient did not have a current IUD in place.  The cervix was grasped on the anterior lip with a single toothed tenaculum. The uterus was sounded to 8 cm. The intrauterine device was then sterilely inserted into the uterine cavity and deployed in the usual fashion, without difficulty. The introducer was removed. The IUD strings were cut 3 cm from the external os of the cervix. The  tenaculum was removed and the site was hemostatic with pressure.   IUD Type: ParaGard Lot #: [id number] Exp: 03/2026  Vaginal ultrasound was not indicated due to the ease of  insertion.  Patient tolerated the procedure well.  Complications: None  INSTRUCTIONS AND PLAN:  Patient instructed about IUD care, checking string placement, and safe sex. Possible changes in bleeding pattern and amount discussed. Reviewed options for pain relief, and signs of infection, expulsion, or misplacement. Patient aware that removal due in 10 years, due 07/2034. Routine health maintenance exams recommended.  Patient to follow-up in 6 weeks for string check or as needed. Aftercare instructions given.   Return in about 6 weeks (around 09/29/2024) for  iud string check.   Attestation Statement:   I personally performed the service, non-incident to. (WP)   Comer Ellen, FNP [facility] 08/18/2024 11:22 AM

## 2024-09-23 ENCOUNTER — Emergency Department
Admission: EM | Admit: 2024-09-23 | Discharge: 2024-09-23 | Disposition: A | Discharge status: Home / Self Care | Attending: Emergency Medicine | Admitting: Emergency Medicine

## 2024-09-23 ENCOUNTER — Emergency Department

## 2024-09-23 ENCOUNTER — Other Ambulatory Visit: Payer: Self-pay

## 2024-09-23 DIAGNOSIS — S63501A Unspecified sprain of right wrist, initial encounter: Secondary | ICD-10-CM | POA: Diagnosis present

## 2024-09-23 DIAGNOSIS — X509XXA Other and unspecified overexertion or strenuous movements or postures, initial encounter: Secondary | ICD-10-CM | POA: Insufficient documentation

## 2024-09-23 NOTE — ED Triage Notes (Signed)
 Pt reports right wrist pain x1week. Pt reports she felt like she injured it getting out of bed by pushing up on it.

## 2024-09-23 NOTE — ED Provider Notes (Signed)
 "  [facility] Provider Note    Event Date/Time   First MD Initiated Contact with Patient 09/23/24 [phone]     (approximate)   History   Wrist Pain   HPI  Alexis Clark is a 29 y.o. female   Past medical history of no significant past medical history presents with right wrist pain.  She does have a 92-1/2-year-old who she picks up a lot.  However a couple mornings ago when she put her hand down on the bed to get up, she noticed pain on the dorsal side ulnar side of her wrist.  There is no direct trauma.  It continues to hurt with ranging over the next couple days.  There is no significant swelling redness or warmth.    External Medical Documents Reviewed: Previous outpatient notes      Physical Exam   Triage Vital Signs: ED Triage Vitals  Encounter Vitals Group     BP 09/23/24 0450 127/84     Girls Systolic BP Percentile --      Girls Diastolic BP Percentile --      Boys Systolic BP Percentile --      Boys Diastolic BP Percentile --      Pulse Rate 09/23/24 0450 67     Resp 09/23/24 0450 18     Temp 09/23/24 0450 97.7 F (36.5 C)     Temp src --      SpO2 09/23/24 0450 98 %     Weight 09/23/24 0449 240 lb (108.9 kg)     Height 09/23/24 0449 5' 5 (1.651 m)     Head Circumference --      Peak Flow --      Pain Score 09/23/24 0449 10     Pain Loc --      Pain Education --      Exclude from Growth Chart --     Most recent vital signs: Vitals:   09/23/24 0450  BP: 127/84  Pulse: 67  Resp: 18  Temp: 97.7 F (36.5 C)  SpO2: 98%    General: Awake, no distress.  CV:  Good peripheral perfusion.  Resp:  Normal effort.  Abd:  No distention.  Other:  Point tenderness to the dorsal side of the wrist mostly on the ulnar side.  No deformity joint swelling redness or warmth.  Neurovascular intact.   ED Results / Procedures / Treatments   Labs (all labs ordered are listed, but only abnormal results are displayed) Labs Reviewed - No data to  display    RADIOLOGY I independently reviewed and interpreted wrist x-ray see no obvious bony irregularities or fracture I also reviewed radiologist's formal read.   PROCEDURES:  Critical Care performed: No  Procedures   MEDICATIONS ORDERED IN ED: Medications - No data to display   IMPRESSION / MDM / ASSESSMENT AND PLAN / ED COURSE  I reviewed the triage vital signs and the nursing notes.                                Patient's presentation is most consistent with acute presentation with potential threat to life or bodily function.  Differential diagnosis includes, but is not limited to, tendinitis, wrist sprain, fracture, dislocation, septic joint considered but less likely, nerve injury is less likely    MDM:    Dorsal /ulnar area wrist pain with negative x-ray on my read, pending formal radiology  read.  Not consistent with septic joint or crystal arthropathy, more likely overuse injury.  Will fit into wrist splint, give anticipatory guidance with anti-inflammatory medications, icing, and she can follow-up with orthopedist if pain does not get better in the next few days.       FINAL CLINICAL IMPRESSION(S) / ED DIAGNOSES   Final diagnoses:  Sprain of right wrist, unspecified location, initial encounter     Rx / DC Orders   ED Discharge Orders     None        Note:  This document was prepared using Dragon voice recognition software and may include unintentional dictation errors.    Cyrena Mylar, MD 09/23/24 [phone]  "

## 2024-09-23 NOTE — Discharge Instructions (Signed)
 You can keep your wrist in the splint for extra support and comfort for the next few days.  You can begin to use some wrist sprain exercises and the document attached.  Take acetaminophen  650 mg and ibuprofen  400 mg every 6 hours for pain.  Take with food.  You can ice the wrist for 20 minutes at a time and elevate it when not in use to help with pain and swelling.  If pain does not get better within the next week you can call Dr. Edie for a follow-up appointment  Thank you for choosing us  for your health care today!  Please see your primary doctor this week for a follow up appointment.   If you have any new, worsening, or unexpected symptoms call your doctor right away or come back to the emergency department for reevaluation.  It was my pleasure to care for you today.   Ginnie EDISON Cyrena, MD
